# Patient Record
Sex: Male | Born: 1960
Health system: Southern US, Community
[De-identification: ages and names within clinical notes are randomized; demographics above are authoritative.]

## PROBLEM LIST (undated history)

## (undated) DIAGNOSIS — C439 Malignant melanoma of skin, unspecified: Secondary | ICD-10-CM

## (undated) HISTORY — PX: MELANOMA EXCISION: SHX5266

---

## 2004-05-20 ENCOUNTER — Emergency Department (HOSPITAL_COMMUNITY): Admission: EM | Admit: 2004-05-20 | Discharge: 2004-05-21 | Payer: Self-pay | Admitting: Emergency Medicine

## 2004-05-21 IMAGING — CR DG CHEST 2V
2 series · 2 of 2 positions shown · non-contrast
Comparison: none

CLINICAL DATA: Chest pain. 
 TWO VIEW CHEST
 No prior studies.
 The heart size and mediastinal contours are unremarkable.  The lungs are clear.  The visualized skeleton is unremarkable.  
 IMPRESSION
 No active disease.

[view not recorded (1 of 2)]
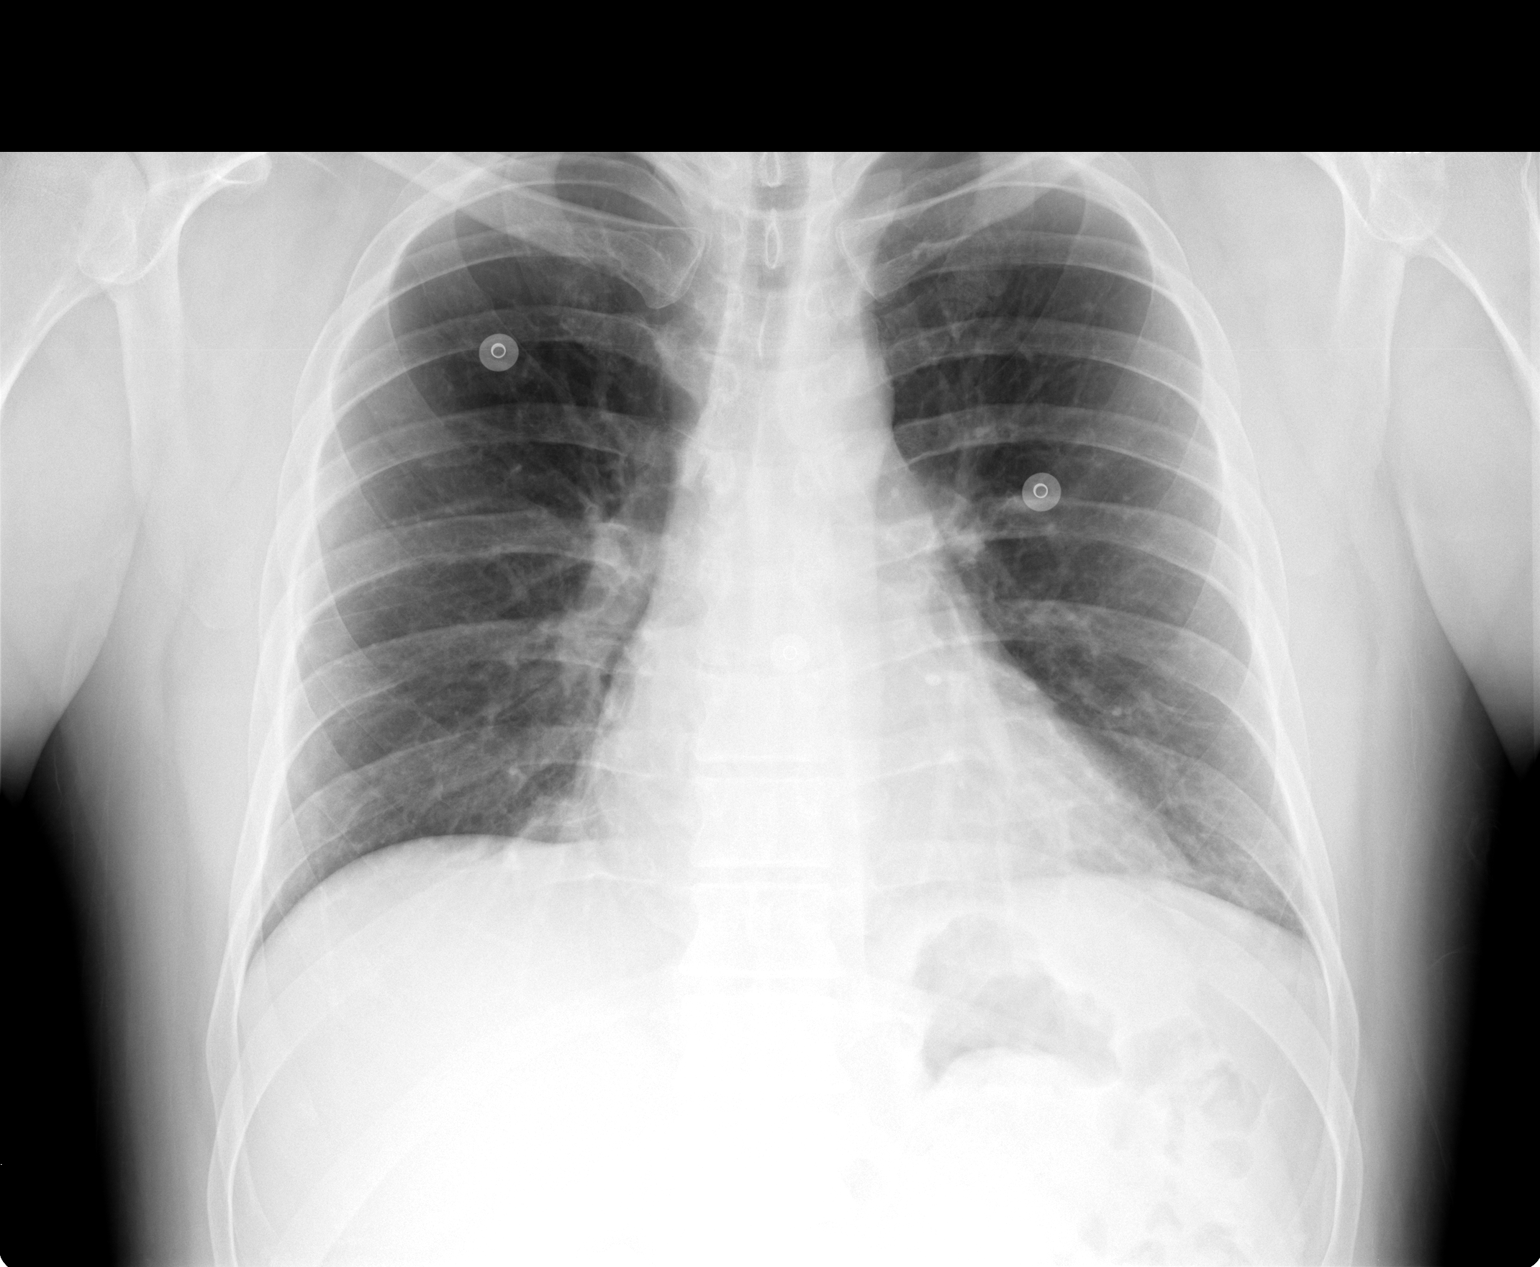

[view not recorded (2 of 2)]
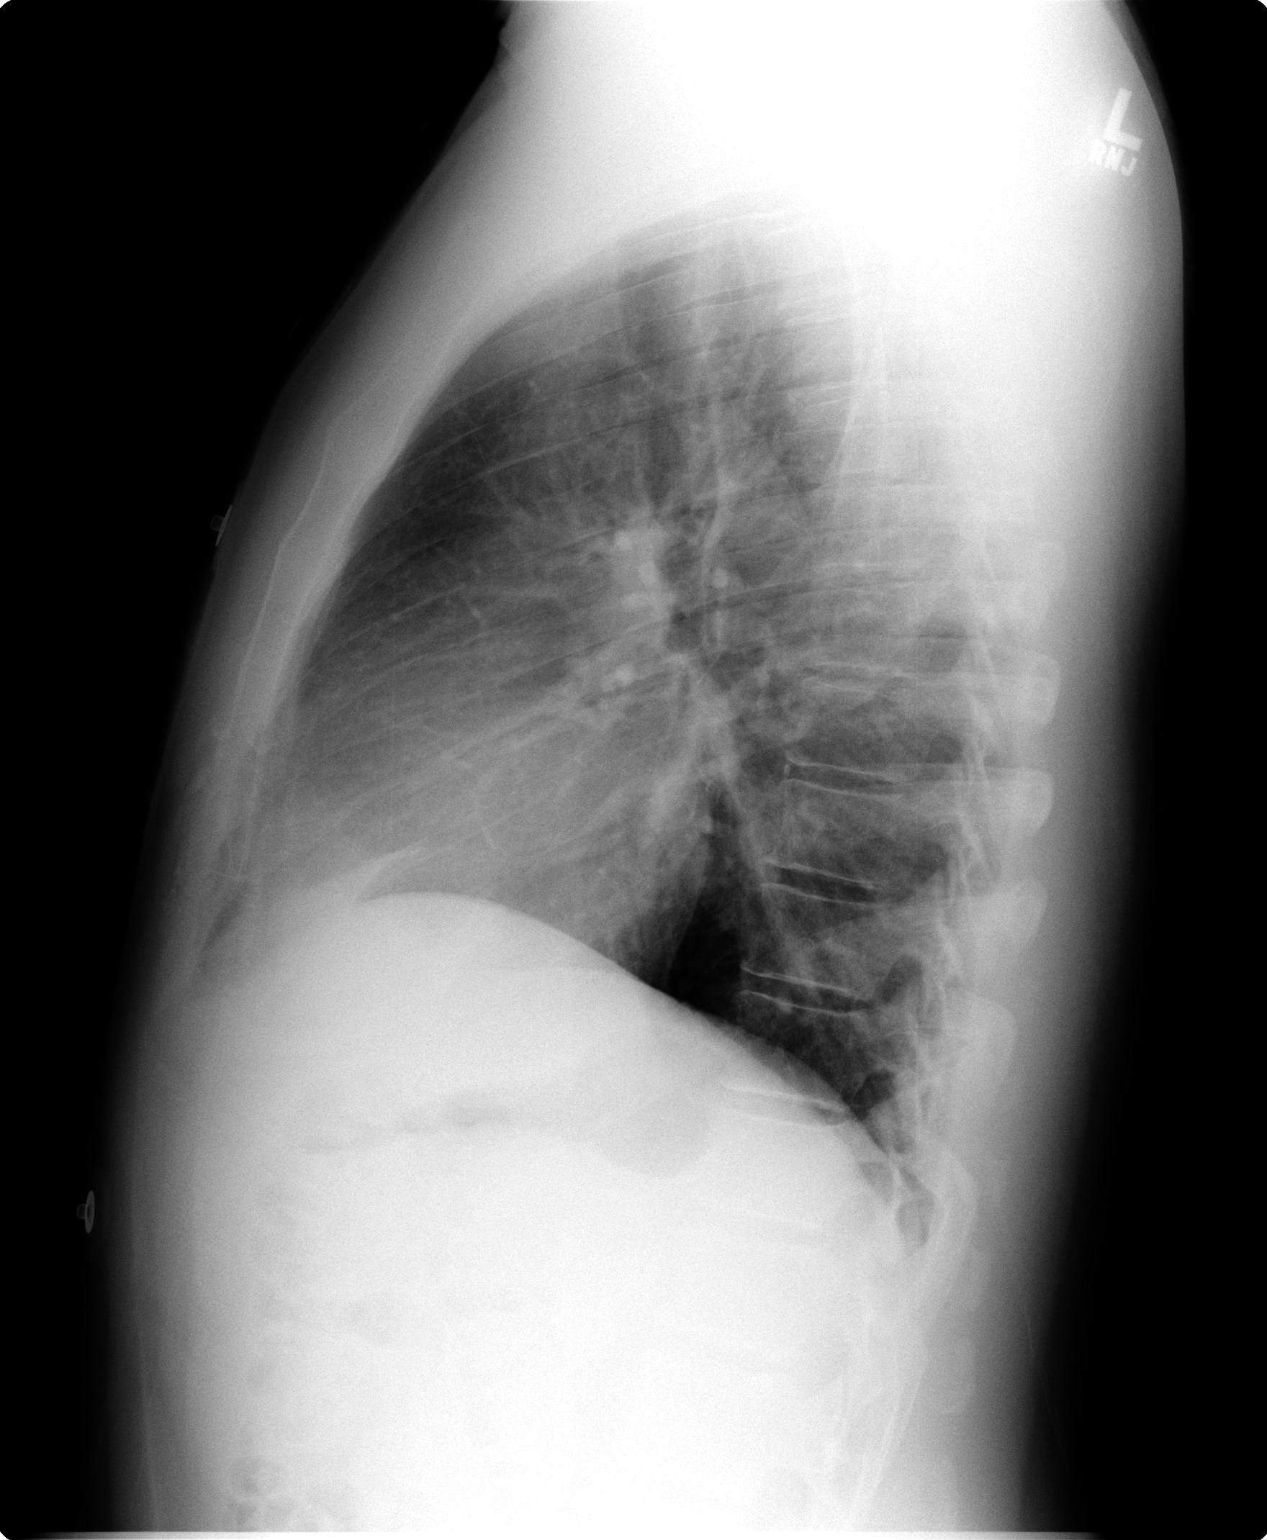

[2 of 2 positions shown; findings below may reference images not displayed]

## 2006-04-15 ENCOUNTER — Emergency Department (HOSPITAL_COMMUNITY): Admission: EM | Admit: 2006-04-15 | Discharge: 2006-04-16 | Payer: Self-pay | Admitting: Emergency Medicine

## 2006-04-15 IMAGING — US US ART/VEN ABD/PELV/SCROTUM DOPPLER COMPLETE
1 series · 14 of 25 positions shown · non-contrast
Comparison: None.

CLINICAL DATA: Bilateral scrotal pain.

SCROTAL ULTRASOUND [DATE]:
TECHNIQUE: Color and duplex Doppler ultrasound was utilized to evaluate blood
flow to the testicles and epididymes.

[Series 1: unknown · 0.09mm/px · 14 of 35 slices shown]
[im 1/35]
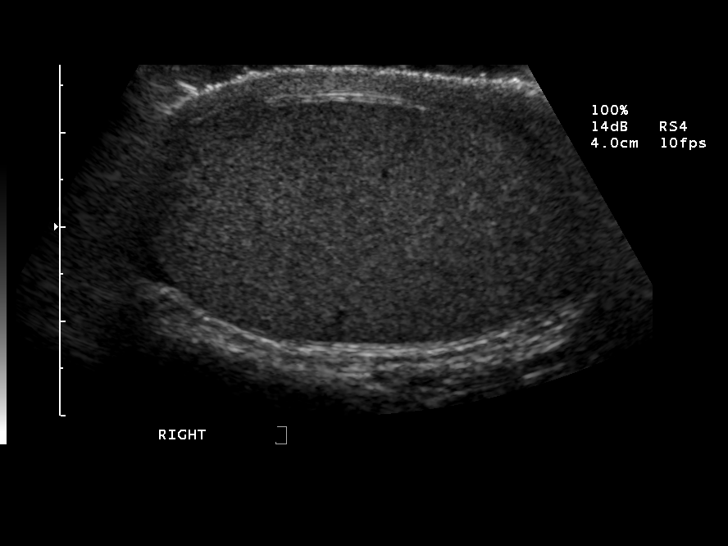
[im 3/35]
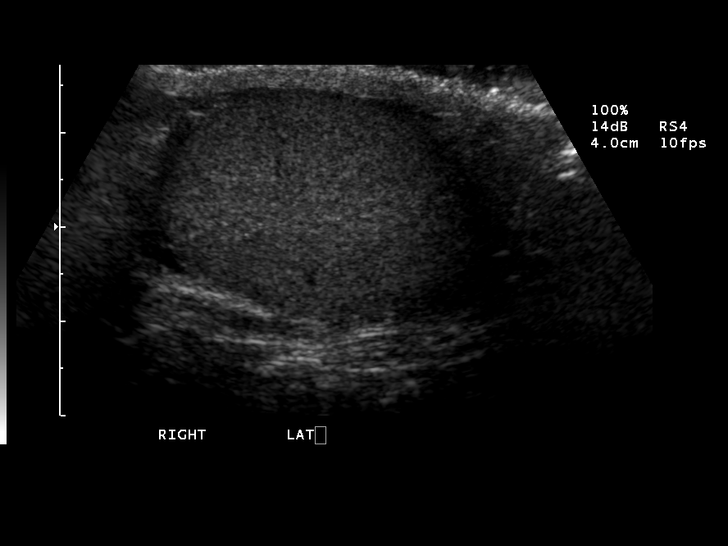
[im 6/35]
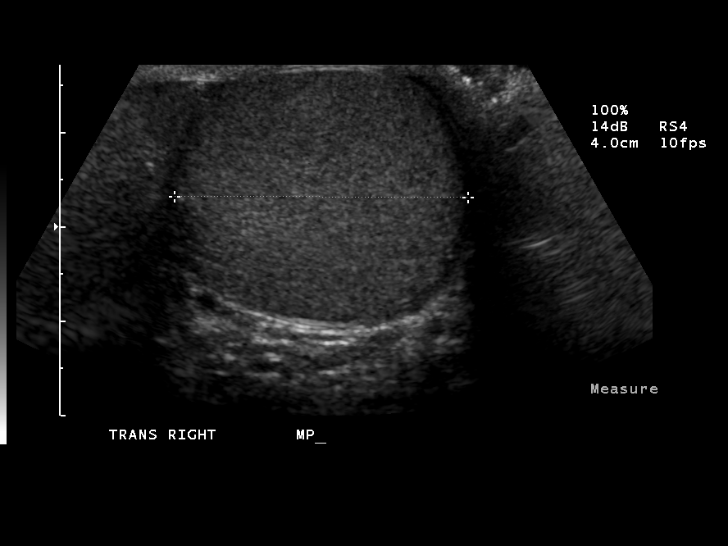
[im 9/35]
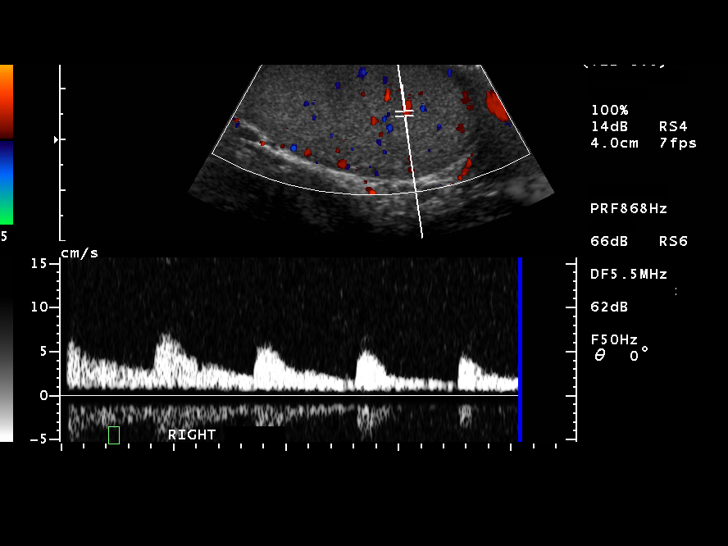
[im 12/35]
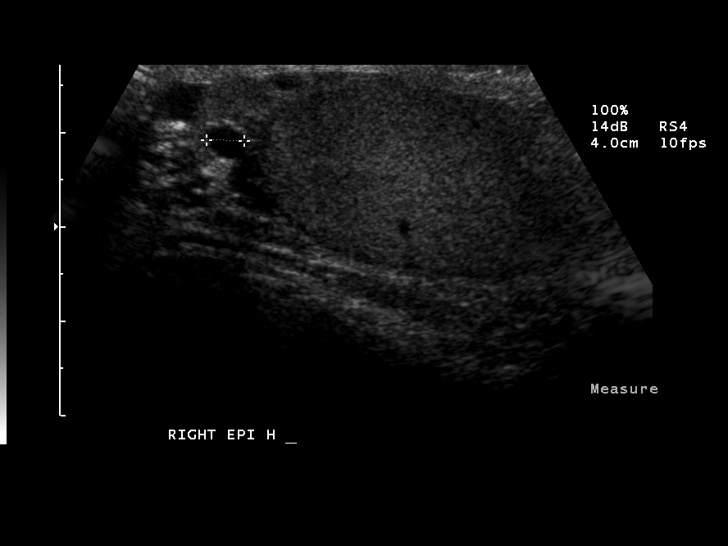
[im 13/35]
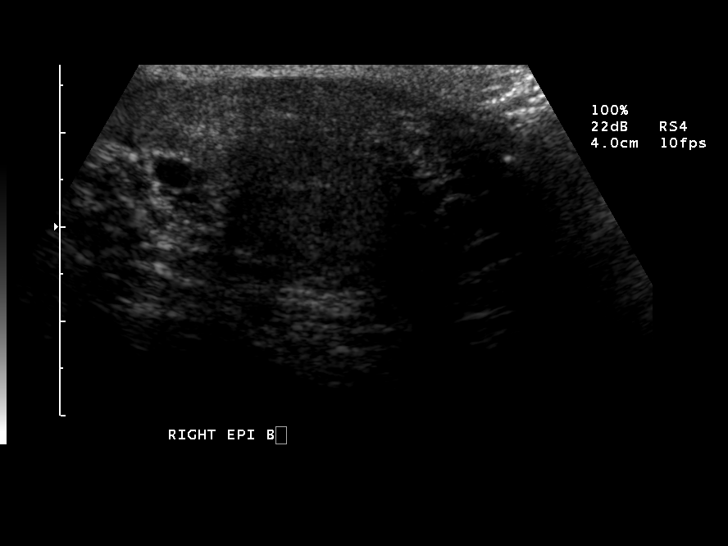
[im 16/35]
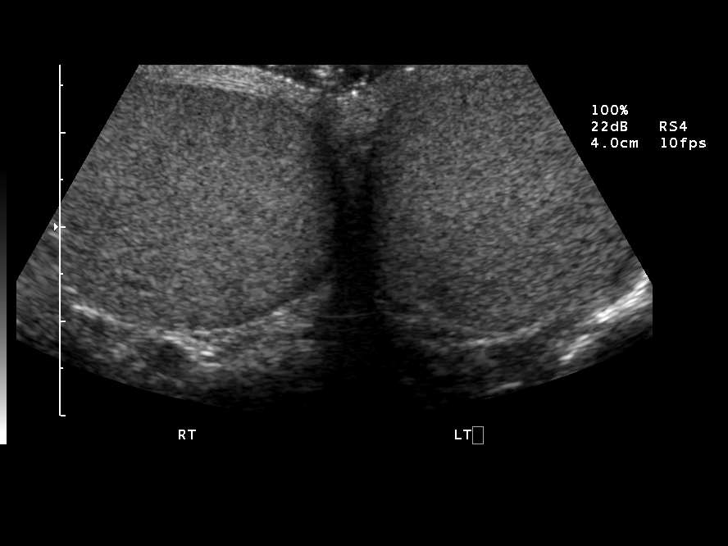
[im 19/35]
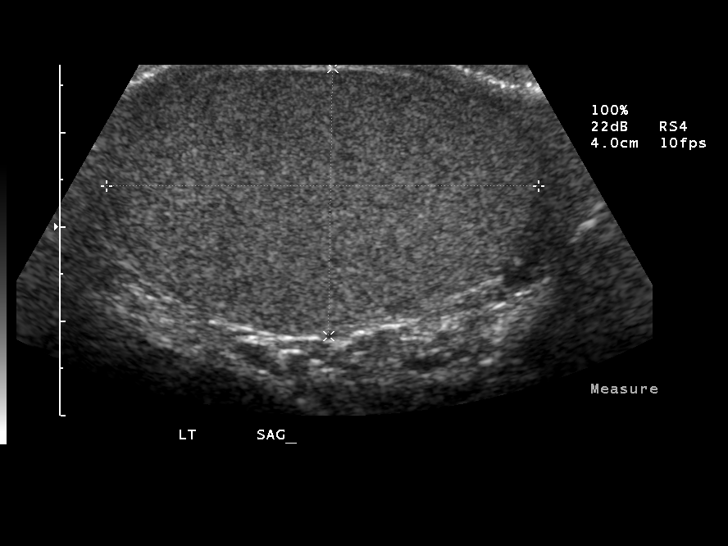
[im 22/35]
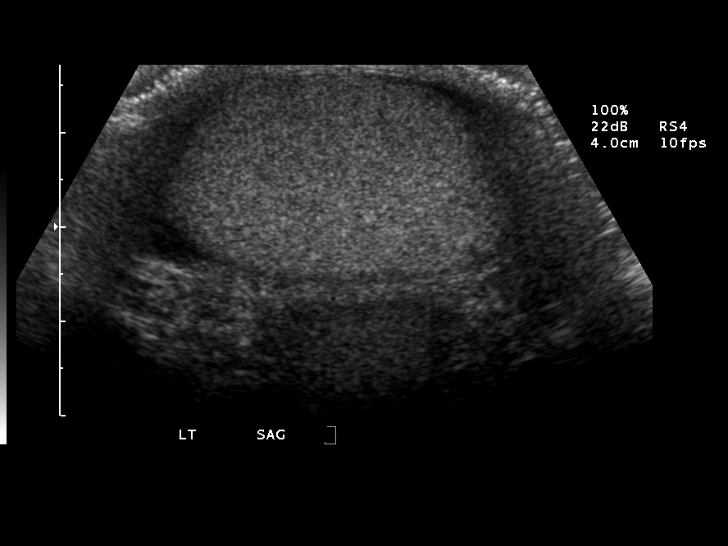
[im 23/35]
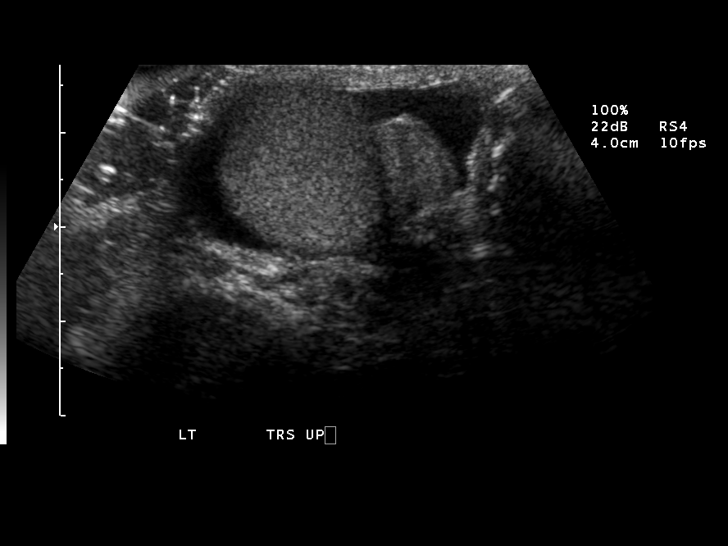
[im 26/35]
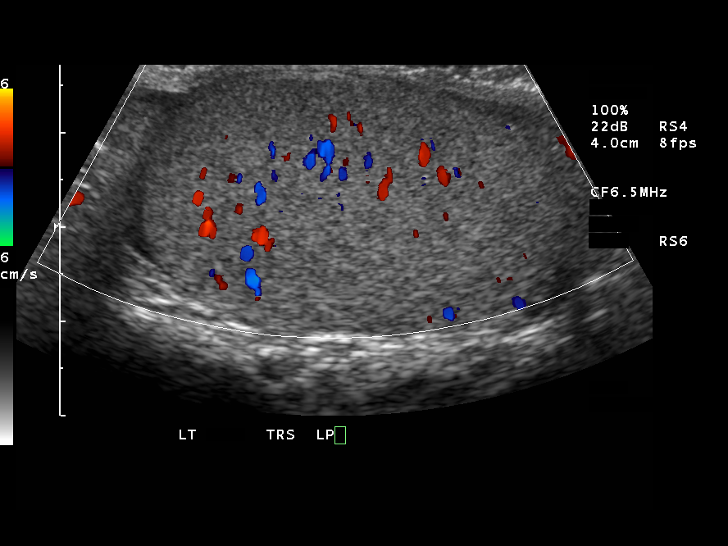
[im 29/35]
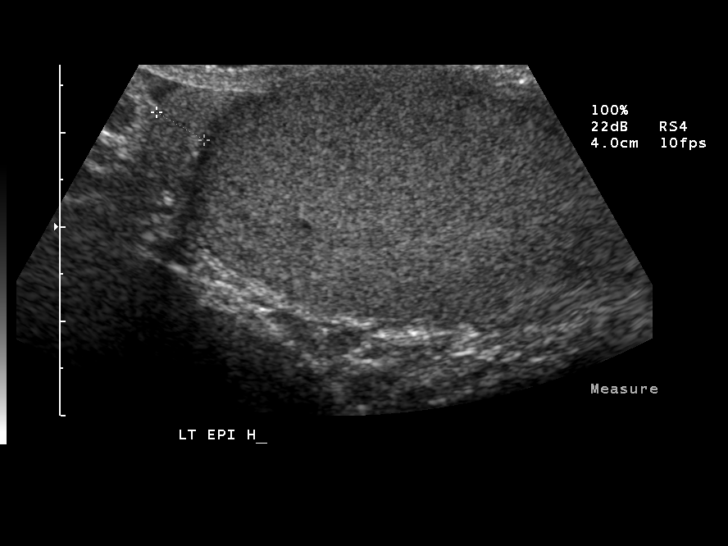
[im 32/35]
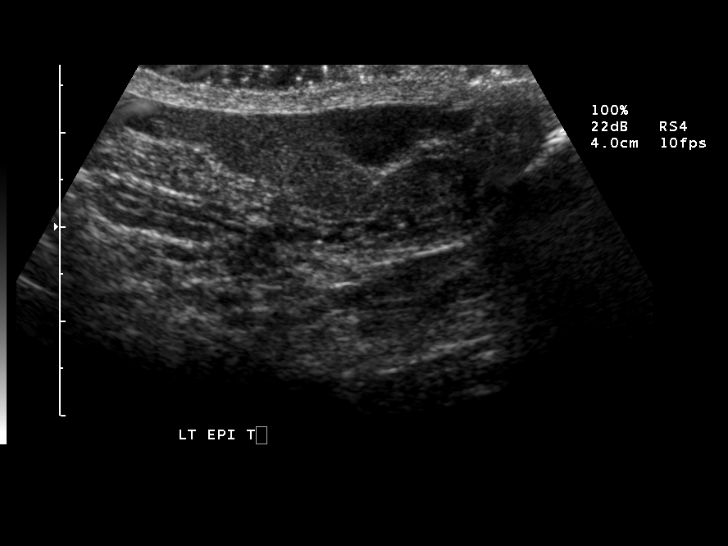
[im 35/35]
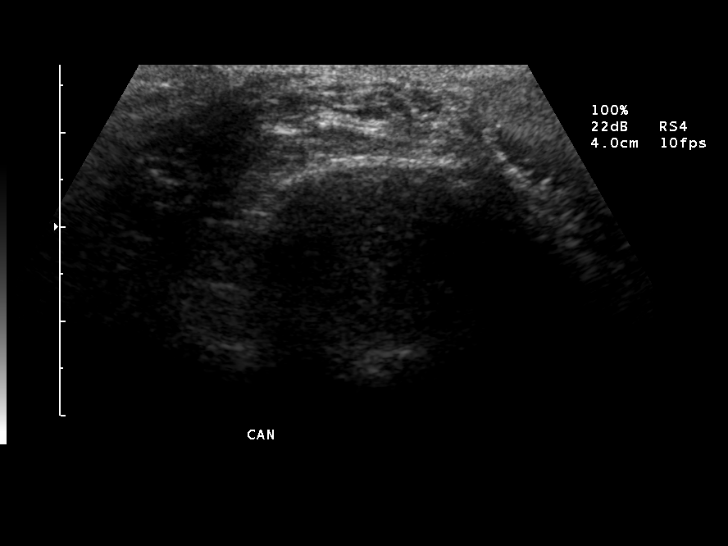

[14 of 25 positions shown; findings below may reference images not displayed]

FINDINGS: Right testicle:  Homogeneous echotexture without focal abnormality.
Approximately 4.6 x 2.6 x 3.1 cm in size.

Left testicle:  Homogeneous echotexture without focal abnormality. Approximately
4.6 x 2.8 x 3.07 meters in size.

Right epididymis:  Approximately 4 mm epididymal cyst. Normal otherwise.

Left epididymis:  Normal.

Other:  No evidence of hydrocele or varicocele.
IMPRESSION: Normal scrotal ultrasound with the exception of a 4 mm right epididymal cyst. 

DOPPLER ULTRASOUND EVALUATION OF THE SCROTUM [DATE]:
FINDINGS: Normal color Doppler signal was identified in both testicles. Normal
arterial and venous waveforms were obtained. Blood flow to the right and left
epididymis is normal. There is no varicocele.
IMPRESSION: No evidence of testicular torsion. Normal blood flow to both testicles and both
epididymis.

## 2016-06-29 DIAGNOSIS — D485 Neoplasm of uncertain behavior of skin: Secondary | ICD-10-CM | POA: Diagnosis not present

## 2016-06-29 DIAGNOSIS — C434 Malignant melanoma of scalp and neck: Secondary | ICD-10-CM | POA: Diagnosis not present

## 2016-06-30 DIAGNOSIS — Z125 Encounter for screening for malignant neoplasm of prostate: Secondary | ICD-10-CM | POA: Diagnosis not present

## 2016-06-30 DIAGNOSIS — E785 Hyperlipidemia, unspecified: Secondary | ICD-10-CM | POA: Diagnosis not present

## 2016-06-30 DIAGNOSIS — Z0000 Encounter for general adult medical examination without abnormal findings: Secondary | ICD-10-CM | POA: Diagnosis not present

## 2016-07-14 DIAGNOSIS — C439 Malignant melanoma of skin, unspecified: Secondary | ICD-10-CM | POA: Diagnosis not present

## 2016-07-27 DIAGNOSIS — C490 Malignant neoplasm of connective and soft tissue of head, face and neck: Secondary | ICD-10-CM | POA: Diagnosis not present

## 2016-07-27 DIAGNOSIS — C439 Malignant melanoma of skin, unspecified: Secondary | ICD-10-CM | POA: Diagnosis not present

## 2016-07-27 DIAGNOSIS — C4322 Malignant melanoma of left ear and external auricular canal: Secondary | ICD-10-CM | POA: Diagnosis not present

## 2016-07-27 DIAGNOSIS — Z79899 Other long term (current) drug therapy: Secondary | ICD-10-CM | POA: Diagnosis not present

## 2016-07-27 DIAGNOSIS — F17200 Nicotine dependence, unspecified, uncomplicated: Secondary | ICD-10-CM | POA: Diagnosis not present

## 2016-09-01 ENCOUNTER — Telehealth: Payer: Self-pay | Admitting: Oncology

## 2016-09-01 ENCOUNTER — Encounter: Payer: Self-pay | Admitting: Oncology

## 2016-09-01 DIAGNOSIS — C434 Malignant melanoma of scalp and neck: Secondary | ICD-10-CM | POA: Diagnosis not present

## 2016-09-01 NOTE — Telephone Encounter (Signed)
Spoke with pt wife and she made me aware pt had surgery completed with [facility].  Consulted with Dr Rolanda Jay and scheduled with [facility].  Spoke with referring provider Helene Kelp) gave appointment date/time and she will contact pt with appt info.

## 2016-09-09 ENCOUNTER — Telehealth: Payer: Self-pay | Admitting: *Deleted

## 2016-09-09 NOTE — Telephone Encounter (Signed)
Called patient to remind him of his appointment with Dr. Alen Blew tomorrow. Patient verbalized understanding.

## 2016-09-10 ENCOUNTER — Ambulatory Visit (HOSPITAL_BASED_OUTPATIENT_CLINIC_OR_DEPARTMENT_OTHER): Payer: 59 | Admitting: Oncology

## 2016-09-10 DIAGNOSIS — C439 Malignant melanoma of skin, unspecified: Secondary | ICD-10-CM | POA: Insufficient documentation

## 2016-09-10 NOTE — Progress Notes (Signed)
[facility] Initial Visit:    CHIEF COMPLAINTS/PURPOSE OF CONSULTATION: Melanoma.       HISTORY OF PRESENTING ILLNESS: Martin Robinson 55 y.o. male currently of [place] where he lived the 35 of his life. He is in excellent health and shape without any comorbid conditions. He was noted to have a lesion around his left ureter noted by his wife. He was noted for the last 6 months and based on these findings he was evaluated by dermatology in August 2017. Initial biopsy showed malignant melanoma with thickness of at least 1 mm without ulceration. Based on these findings he was referred to Dr. Clovis Riley at [facility]. He underwent wide excision of a the left posterior auricular area and sentinel lymph node sampling on 07/27/2016. The final pathology showed residual melanoma with the depth of invasion of 0.3 mm for a total of 1.3 mm with original melanoma. 2 lymph nodes showed no evidence of malignancy and the third lymph node in the inferior part area showed a few isolated cells positive for pan mel. Patient recovered well from surgery without any recent complaints. He denied any previous history of melanoma or family history. He does have history of excessive skin exposure as a child. He has no other complaints at this time.  He does not report any headaches, blurry vision, syncope or seizures. He is not reporting any fevers or chills or sweats. He does not report any cough, wheezing or hemoptysis. He does not report any nausea, vomiting or abdominal pain. He does not report any frequency urgency or hesitancy. He does not report any skeletal complaints. Remaining review of systems unremarkable.  Review of Systems  Constitutional: Negative.   HENT:  Negative.   Eyes: Negative.   Endocrine: Negative.   Genitourinary: Negative.    Skin: Negative.   Neurological: Negative.   Hematological: Negative.   Psychiatric/Behavioral: Negative.    His past  medical history and surgical history was unremarkable previous 2 recent diagnosis of melanoma.  SOCIAL HISTORY: Social History   Social History  . Marital status: Married.     Spouse name: N/A  . Number of children: N/A  . Years of education: N/A   Occupational History  . Not on file.   Social History Main Topics  . Smoking status: Nonsmoker occasionally uses cigars.   . Smokeless tobacco: Not on file  . Alcohol use Not on file  . Drug use: Unknown  . Sexual activity: Not on file   Other Topics Concern  . Not on file   Social History Narrative  . No narrative on file    FAMILY HISTORY Denied any history of melanoma or basal cell carcinoma is noted.  ALLERGIES:  has No Known Allergies.  MEDICATIONS: He takes no medication.    PHYSICAL EXAMINATION:  ECOG PERFORMANCE STATUS: 0 - Asymptomatic   Vitals:   09/10/16 1107  BP: 103/77  Pulse: 82  Resp: 18  Temp: 98.2 F (36.8 C)    Filed Weights   09/10/16 1107  Weight: 188 lb 1.6 oz (85.3 kg)     Physical Exam  Constitutional: He is oriented to person, place, and time and well-developed, well-nourished, and in no distress.  HENT:  Head: Normocephalic and atraumatic.  Eyes: Pupils are equal, round, and reactive to light.  Neck: Neck supple.  Noted scar on the left periauricular area. No lymphadenopathy.  Cardiovascular: Normal rate, regular rhythm and normal heart sounds.   Pulmonary/Chest: Breath sounds  normal. No respiratory distress.  Abdominal: Soft. He exhibits no distension. There is no tenderness. There is no rebound.  Musculoskeletal: Normal range of motion.  Neurological: He is alert and oriented to person, place, and time.  Skin: Skin is warm. No rash noted.  No rashes or lesions noted.    ASSESSMENT/PLAN   55 year old gentleman with the following issues:  1. Malignant melanoma diagnosed in August 2017. He was noted to have a left auricular lesion that was biopsied and proven to be  malignant melanoma. He is status post wide excision and sentinel lymph node sampling with the final pathology September 2017 showed total depth of invasion of 1.3 mm without any evidence of lymph node involvement. There are a few scattered cells that stained positive for pan mel although no convincing evidence of metastatic involvement and these lymph glands.  The natural course of this disease was discussed with the patient and his wife. His case was also discussed in the melanoma tumor board and his pathology slides were reviewed. At this time is felt that his disease remains at low risk and does not warrant adjuvant therapy given the low volume disease in these lymph glands and possibly these findings related to false-positive as rather than actual melanoma spread. However, he is at increased risk of developing recurrent disease as well as developing other melanoma of the skin. Continue with surveillance is paramount and I have recommended continue dermatology surveillance with periodic physical examination and lymph node basin palpation required.  He is agreeable with this plan and has dermatology follow-up already in place. I have offered my services to continue surveillance if he wishes to do so.  2. Skin protection strategies: These were discussed today in detail including wearing the appropriate clothing and sunblock to prevent further skin damage and prevent other skin cancer.  3. Follow-up: I'm happy to see him in the future as needed.  All questions were answered. The patient knows to call the clinic with any problems, questions or concerns.  This note was electronically signed.    [facility], MD  09/10/2016 11:37 AM

## 2016-09-20 DIAGNOSIS — Z23 Encounter for immunization: Secondary | ICD-10-CM | POA: Diagnosis not present

## 2016-09-20 DIAGNOSIS — D1800 Hemangioma unspecified site: Secondary | ICD-10-CM | POA: Diagnosis not present

## 2016-09-20 DIAGNOSIS — Z85820 Personal history of malignant melanoma of skin: Secondary | ICD-10-CM | POA: Diagnosis not present

## 2016-09-20 DIAGNOSIS — L570 Actinic keratosis: Secondary | ICD-10-CM | POA: Diagnosis not present

## 2016-09-20 DIAGNOSIS — L814 Other melanin hyperpigmentation: Secondary | ICD-10-CM | POA: Diagnosis not present

## 2016-09-20 DIAGNOSIS — L821 Other seborrheic keratosis: Secondary | ICD-10-CM | POA: Diagnosis not present

## 2016-09-20 DIAGNOSIS — D2261 Melanocytic nevi of right upper limb, including shoulder: Secondary | ICD-10-CM | POA: Diagnosis not present

## 2016-09-20 DIAGNOSIS — D225 Melanocytic nevi of trunk: Secondary | ICD-10-CM | POA: Diagnosis not present

## 2017-02-04 DIAGNOSIS — Z9221 Personal history of antineoplastic chemotherapy: Secondary | ICD-10-CM | POA: Diagnosis not present

## 2017-02-04 DIAGNOSIS — C439 Malignant melanoma of skin, unspecified: Secondary | ICD-10-CM | POA: Diagnosis not present

## 2017-02-04 DIAGNOSIS — Z87891 Personal history of nicotine dependence: Secondary | ICD-10-CM | POA: Diagnosis not present

## 2017-02-04 DIAGNOSIS — C434 Malignant melanoma of scalp and neck: Secondary | ICD-10-CM | POA: Diagnosis not present

## 2017-06-30 DIAGNOSIS — Z85820 Personal history of malignant melanoma of skin: Secondary | ICD-10-CM | POA: Diagnosis not present

## 2017-06-30 DIAGNOSIS — L821 Other seborrheic keratosis: Secondary | ICD-10-CM | POA: Diagnosis not present

## 2017-06-30 DIAGNOSIS — L814 Other melanin hyperpigmentation: Secondary | ICD-10-CM | POA: Diagnosis not present

## 2017-06-30 DIAGNOSIS — D485 Neoplasm of uncertain behavior of skin: Secondary | ICD-10-CM | POA: Diagnosis not present

## 2017-06-30 DIAGNOSIS — L570 Actinic keratosis: Secondary | ICD-10-CM | POA: Diagnosis not present

## 2017-06-30 DIAGNOSIS — D225 Melanocytic nevi of trunk: Secondary | ICD-10-CM | POA: Diagnosis not present

## 2017-06-30 DIAGNOSIS — D1800 Hemangioma unspecified site: Secondary | ICD-10-CM | POA: Diagnosis not present

## 2017-07-14 DIAGNOSIS — Z125 Encounter for screening for malignant neoplasm of prostate: Secondary | ICD-10-CM | POA: Diagnosis not present

## 2017-07-14 DIAGNOSIS — Z23 Encounter for immunization: Secondary | ICD-10-CM | POA: Diagnosis not present

## 2017-07-14 DIAGNOSIS — Z0000 Encounter for general adult medical examination without abnormal findings: Secondary | ICD-10-CM | POA: Diagnosis not present

## 2017-07-14 DIAGNOSIS — E7800 Pure hypercholesterolemia, unspecified: Secondary | ICD-10-CM | POA: Diagnosis not present

## 2017-07-14 DIAGNOSIS — Z1211 Encounter for screening for malignant neoplasm of colon: Secondary | ICD-10-CM | POA: Diagnosis not present

## 2017-10-13 DIAGNOSIS — E7800 Pure hypercholesterolemia, unspecified: Secondary | ICD-10-CM | POA: Diagnosis not present

## 2017-10-20 DIAGNOSIS — Z23 Encounter for immunization: Secondary | ICD-10-CM | POA: Diagnosis not present

## 2017-11-23 DIAGNOSIS — E7800 Pure hypercholesterolemia, unspecified: Secondary | ICD-10-CM | POA: Diagnosis not present

## 2017-12-28 MED FILL — ROSUVASTATIN CALCIUM 10 MG: 10 | 30 days supply | Qty: 30 | Fill #0

## 2018-01-05 DIAGNOSIS — E7800 Pure hypercholesterolemia, unspecified: Secondary | ICD-10-CM | POA: Diagnosis not present

## 2018-01-26 MED FILL — ROSUVASTATIN CALCIUM 10 MG: 10 | 30 days supply | Qty: 30 | Fill #0

## 2018-02-16 DIAGNOSIS — L814 Other melanin hyperpigmentation: Secondary | ICD-10-CM | POA: Diagnosis not present

## 2018-02-16 DIAGNOSIS — D485 Neoplasm of uncertain behavior of skin: Secondary | ICD-10-CM | POA: Diagnosis not present

## 2018-02-16 DIAGNOSIS — L821 Other seborrheic keratosis: Secondary | ICD-10-CM | POA: Diagnosis not present

## 2018-02-16 DIAGNOSIS — L570 Actinic keratosis: Secondary | ICD-10-CM | POA: Diagnosis not present

## 2018-02-16 DIAGNOSIS — L219 Seborrheic dermatitis, unspecified: Secondary | ICD-10-CM | POA: Diagnosis not present

## 2018-02-16 DIAGNOSIS — D1800 Hemangioma unspecified site: Secondary | ICD-10-CM | POA: Diagnosis not present

## 2018-02-16 DIAGNOSIS — Z85820 Personal history of malignant melanoma of skin: Secondary | ICD-10-CM | POA: Diagnosis not present

## 2018-02-16 DIAGNOSIS — C44319 Basal cell carcinoma of skin of other parts of face: Secondary | ICD-10-CM | POA: Diagnosis not present

## 2018-02-16 DIAGNOSIS — D225 Melanocytic nevi of trunk: Secondary | ICD-10-CM | POA: Diagnosis not present

## 2018-03-17 MED FILL — ROSUVASTATIN CALCIUM 10 MG: 10 | 30 days supply | Qty: 30 | Fill #1

## 2018-04-13 DIAGNOSIS — Z23 Encounter for immunization: Secondary | ICD-10-CM | POA: Diagnosis not present

## 2018-04-18 DIAGNOSIS — C44319 Basal cell carcinoma of skin of other parts of face: Secondary | ICD-10-CM | POA: Diagnosis not present

## 2018-07-27 DIAGNOSIS — Z0000 Encounter for general adult medical examination without abnormal findings: Secondary | ICD-10-CM | POA: Diagnosis not present

## 2018-07-27 DIAGNOSIS — Z125 Encounter for screening for malignant neoplasm of prostate: Secondary | ICD-10-CM | POA: Diagnosis not present

## 2018-07-27 DIAGNOSIS — Z1211 Encounter for screening for malignant neoplasm of colon: Secondary | ICD-10-CM | POA: Diagnosis not present

## 2018-07-27 DIAGNOSIS — Z85820 Personal history of malignant melanoma of skin: Secondary | ICD-10-CM | POA: Diagnosis not present

## 2018-07-27 DIAGNOSIS — Z23 Encounter for immunization: Secondary | ICD-10-CM | POA: Diagnosis not present

## 2018-07-27 DIAGNOSIS — E7800 Pure hypercholesterolemia, unspecified: Secondary | ICD-10-CM | POA: Diagnosis not present

## 2018-08-15 DIAGNOSIS — L814 Other melanin hyperpigmentation: Secondary | ICD-10-CM | POA: Diagnosis not present

## 2018-08-15 DIAGNOSIS — Z85820 Personal history of malignant melanoma of skin: Secondary | ICD-10-CM | POA: Diagnosis not present

## 2018-08-15 DIAGNOSIS — Z23 Encounter for immunization: Secondary | ICD-10-CM | POA: Diagnosis not present

## 2018-08-15 DIAGNOSIS — L821 Other seborrheic keratosis: Secondary | ICD-10-CM | POA: Diagnosis not present

## 2018-08-15 DIAGNOSIS — Z85828 Personal history of other malignant neoplasm of skin: Secondary | ICD-10-CM | POA: Diagnosis not present

## 2018-08-15 DIAGNOSIS — D1800 Hemangioma unspecified site: Secondary | ICD-10-CM | POA: Diagnosis not present

## 2018-08-15 DIAGNOSIS — L570 Actinic keratosis: Secondary | ICD-10-CM | POA: Diagnosis not present

## 2018-08-15 DIAGNOSIS — D225 Melanocytic nevi of trunk: Secondary | ICD-10-CM | POA: Diagnosis not present

## 2018-09-26 DIAGNOSIS — H5203 Hypermetropia, bilateral: Secondary | ICD-10-CM | POA: Diagnosis not present

## 2019-03-12 DIAGNOSIS — Z85828 Personal history of other malignant neoplasm of skin: Secondary | ICD-10-CM | POA: Diagnosis not present

## 2019-03-12 DIAGNOSIS — L821 Other seborrheic keratosis: Secondary | ICD-10-CM | POA: Diagnosis not present

## 2019-03-12 DIAGNOSIS — D225 Melanocytic nevi of trunk: Secondary | ICD-10-CM | POA: Diagnosis not present

## 2019-03-12 DIAGNOSIS — L570 Actinic keratosis: Secondary | ICD-10-CM | POA: Diagnosis not present

## 2019-03-12 DIAGNOSIS — Z85820 Personal history of malignant melanoma of skin: Secondary | ICD-10-CM | POA: Diagnosis not present

## 2019-03-12 DIAGNOSIS — L814 Other melanin hyperpigmentation: Secondary | ICD-10-CM | POA: Diagnosis not present

## 2019-09-26 DIAGNOSIS — D225 Melanocytic nevi of trunk: Secondary | ICD-10-CM | POA: Diagnosis not present

## 2019-09-26 DIAGNOSIS — L570 Actinic keratosis: Secondary | ICD-10-CM | POA: Diagnosis not present

## 2019-09-26 DIAGNOSIS — Z85820 Personal history of malignant melanoma of skin: Secondary | ICD-10-CM | POA: Diagnosis not present

## 2019-09-26 DIAGNOSIS — L820 Inflamed seborrheic keratosis: Secondary | ICD-10-CM | POA: Diagnosis not present

## 2019-09-26 DIAGNOSIS — L821 Other seborrheic keratosis: Secondary | ICD-10-CM | POA: Diagnosis not present

## 2019-09-26 DIAGNOSIS — Z85828 Personal history of other malignant neoplasm of skin: Secondary | ICD-10-CM | POA: Diagnosis not present

## 2019-09-26 DIAGNOSIS — L814 Other melanin hyperpigmentation: Secondary | ICD-10-CM | POA: Diagnosis not present

## 2019-09-27 DIAGNOSIS — Z13220 Encounter for screening for lipoid disorders: Secondary | ICD-10-CM | POA: Diagnosis not present

## 2019-09-27 DIAGNOSIS — Z125 Encounter for screening for malignant neoplasm of prostate: Secondary | ICD-10-CM | POA: Diagnosis not present

## 2019-09-27 DIAGNOSIS — Z1159 Encounter for screening for other viral diseases: Secondary | ICD-10-CM | POA: Diagnosis not present

## 2019-09-27 DIAGNOSIS — Z85820 Personal history of malignant melanoma of skin: Secondary | ICD-10-CM | POA: Diagnosis not present

## 2019-09-27 DIAGNOSIS — R7301 Impaired fasting glucose: Secondary | ICD-10-CM | POA: Diagnosis not present

## 2019-09-27 DIAGNOSIS — Z0000 Encounter for general adult medical examination without abnormal findings: Secondary | ICD-10-CM | POA: Diagnosis not present

## 2019-09-27 DIAGNOSIS — Z23 Encounter for immunization: Secondary | ICD-10-CM | POA: Diagnosis not present

## 2020-03-25 DIAGNOSIS — D225 Melanocytic nevi of trunk: Secondary | ICD-10-CM | POA: Diagnosis not present

## 2020-03-25 DIAGNOSIS — Z85820 Personal history of malignant melanoma of skin: Secondary | ICD-10-CM | POA: Diagnosis not present

## 2020-03-25 DIAGNOSIS — Z85828 Personal history of other malignant neoplasm of skin: Secondary | ICD-10-CM | POA: Diagnosis not present

## 2020-03-25 DIAGNOSIS — L578 Other skin changes due to chronic exposure to nonionizing radiation: Secondary | ICD-10-CM | POA: Diagnosis not present

## 2020-03-25 DIAGNOSIS — L814 Other melanin hyperpigmentation: Secondary | ICD-10-CM | POA: Diagnosis not present

## 2020-03-25 DIAGNOSIS — L570 Actinic keratosis: Secondary | ICD-10-CM | POA: Diagnosis not present

## 2020-03-25 DIAGNOSIS — L821 Other seborrheic keratosis: Secondary | ICD-10-CM | POA: Diagnosis not present

## 2020-07-13 DIAGNOSIS — U071 COVID-19: Secondary | ICD-10-CM | POA: Diagnosis not present

## 2020-07-13 DIAGNOSIS — R509 Fever, unspecified: Secondary | ICD-10-CM | POA: Diagnosis not present

## 2020-07-13 DIAGNOSIS — R05 Cough: Secondary | ICD-10-CM | POA: Diagnosis not present

## 2020-07-13 DIAGNOSIS — Z20822 Contact with and (suspected) exposure to covid-19: Secondary | ICD-10-CM | POA: Diagnosis not present

## 2020-09-30 DIAGNOSIS — Z125 Encounter for screening for malignant neoplasm of prostate: Secondary | ICD-10-CM | POA: Diagnosis not present

## 2020-09-30 DIAGNOSIS — Z0000 Encounter for general adult medical examination without abnormal findings: Secondary | ICD-10-CM | POA: Diagnosis not present

## 2020-09-30 DIAGNOSIS — R7303 Prediabetes: Secondary | ICD-10-CM | POA: Diagnosis not present

## 2020-09-30 DIAGNOSIS — Z85820 Personal history of malignant melanoma of skin: Secondary | ICD-10-CM | POA: Diagnosis not present

## 2020-09-30 DIAGNOSIS — E7800 Pure hypercholesterolemia, unspecified: Secondary | ICD-10-CM | POA: Diagnosis not present

## 2020-10-07 DIAGNOSIS — L821 Other seborrheic keratosis: Secondary | ICD-10-CM | POA: Diagnosis not present

## 2020-10-07 DIAGNOSIS — Z85820 Personal history of malignant melanoma of skin: Secondary | ICD-10-CM | POA: Diagnosis not present

## 2020-10-07 DIAGNOSIS — Z85828 Personal history of other malignant neoplasm of skin: Secondary | ICD-10-CM | POA: Diagnosis not present

## 2020-10-07 DIAGNOSIS — D225 Melanocytic nevi of trunk: Secondary | ICD-10-CM | POA: Diagnosis not present

## 2020-10-07 DIAGNOSIS — L814 Other melanin hyperpigmentation: Secondary | ICD-10-CM | POA: Diagnosis not present

## 2020-10-07 DIAGNOSIS — L578 Other skin changes due to chronic exposure to nonionizing radiation: Secondary | ICD-10-CM | POA: Diagnosis not present

## 2020-10-07 DIAGNOSIS — L570 Actinic keratosis: Secondary | ICD-10-CM | POA: Diagnosis not present

## 2021-07-03 ENCOUNTER — Observation Stay (HOSPITAL_BASED_OUTPATIENT_CLINIC_OR_DEPARTMENT_OTHER)
Admission: EM | Admit: 2021-07-03 | Discharge: 2021-07-04 | Disposition: A | Discharge status: Home / Self Care | Payer: Commercial Managed Care - PPO | Attending: Internal Medicine | Admitting: Internal Medicine

## 2021-07-03 ENCOUNTER — Other Ambulatory Visit: Payer: Self-pay

## 2021-07-03 ENCOUNTER — Encounter (HOSPITAL_BASED_OUTPATIENT_CLINIC_OR_DEPARTMENT_OTHER): Payer: Self-pay

## 2021-07-03 ENCOUNTER — Observation Stay (HOSPITAL_BASED_OUTPATIENT_CLINIC_OR_DEPARTMENT_OTHER): Payer: Commercial Managed Care - PPO

## 2021-07-03 ENCOUNTER — Emergency Department (HOSPITAL_BASED_OUTPATIENT_CLINIC_OR_DEPARTMENT_OTHER): Payer: Commercial Managed Care - PPO

## 2021-07-03 ENCOUNTER — Observation Stay (HOSPITAL_COMMUNITY): Payer: Commercial Managed Care - PPO

## 2021-07-03 DIAGNOSIS — I10 Essential (primary) hypertension: Secondary | ICD-10-CM | POA: Diagnosis not present

## 2021-07-03 DIAGNOSIS — F17210 Nicotine dependence, cigarettes, uncomplicated: Secondary | ICD-10-CM | POA: Insufficient documentation

## 2021-07-03 DIAGNOSIS — E785 Hyperlipidemia, unspecified: Secondary | ICD-10-CM

## 2021-07-03 DIAGNOSIS — R29810 Facial weakness: Secondary | ICD-10-CM | POA: Diagnosis present

## 2021-07-03 DIAGNOSIS — I6381 Other cerebral infarction due to occlusion or stenosis of small artery: Secondary | ICD-10-CM

## 2021-07-03 DIAGNOSIS — I639 Cerebral infarction, unspecified: Secondary | ICD-10-CM | POA: Diagnosis not present

## 2021-07-03 DIAGNOSIS — I6389 Other cerebral infarction: Secondary | ICD-10-CM

## 2021-07-03 DIAGNOSIS — Z20822 Contact with and (suspected) exposure to covid-19: Secondary | ICD-10-CM | POA: Insufficient documentation

## 2021-07-03 DIAGNOSIS — Y900 Blood alcohol level of less than 20 mg/100 ml: Secondary | ICD-10-CM | POA: Diagnosis not present

## 2021-07-03 HISTORY — DX: Hyperlipidemia, unspecified: E78.5

## 2021-07-03 HISTORY — DX: Malignant melanoma of skin, unspecified: C43.9

## 2021-07-03 LAB — COMPREHENSIVE METABOLIC PANEL
ALT: 17 U/L (ref 0–44)
AST: 18 U/L (ref 15–41)
Albumin: 4.2 g/dL (ref 3.5–5.0)
Alkaline Phosphatase: 56 U/L (ref 38–126)
Anion gap: 7 (ref 5–15)
BUN: 15 mg/dL (ref 6–20)
CO2: 28 mmol/L (ref 22–32)
Calcium: 9.3 mg/dL (ref 8.9–10.3)
Chloride: 101 mmol/L (ref 98–111)
Creatinine, Ser: 1.09 mg/dL (ref 0.61–1.24)
GFR, Estimated: >60 mL/min (ref >60)
Glucose, Bld: 94 mg/dL (ref 70–99)
Potassium: 3.5 mmol/L (ref 3.5–5.1)
Sodium: 136 mmol/L (ref 135–145)
Total Bilirubin: 0.5 mg/dL (ref 0.3–1.2)
Total Protein: 6.8 g/dL (ref 6.5–8.1)

## 2021-07-03 LAB — CBC
HCT: 40.7 % (ref 39.0–52.0)
Hemoglobin: 13.6 g/dL (ref 13.0–17.0)
MCH: 30.4 pg (ref 26.0–34.0)
MCHC: 33.4 g/dL (ref 30.0–36.0)
MCV: 91.1 fL (ref 80.0–100.0)
Platelets: 236 10*3/uL (ref 150–400)
RBC: 4.47 MIL/uL (ref 4.22–5.81)
RDW: 12.5 % (ref 11.5–15.5)
WBC: 6.1 10*3/uL (ref 4.0–10.5)
nRBC: 0 % (ref 0.0–0.2)

## 2021-07-03 LAB — URINALYSIS, ROUTINE W REFLEX MICROSCOPIC
Bilirubin Urine: NEGATIVE
Glucose, UA: NEGATIVE mg/dL
Hgb urine dipstick: NEGATIVE
Ketones, ur: NEGATIVE mg/dL
Leukocytes,Ua: NEGATIVE
Nitrite: NEGATIVE
Protein, ur: NEGATIVE mg/dL
Specific Gravity, Urine: <1.005 — ABNORMAL LOW (ref 1.005–1.030)
pH: 6 (ref 5.0–8.0)

## 2021-07-03 LAB — APTT: aPTT: 27 seconds (ref 24–36)

## 2021-07-03 LAB — ECHOCARDIOGRAM COMPLETE
AR max vel: 3.9 cm2
AV Area VTI: 3.6 cm2
AV Area mean vel: 3.7 cm2
AV Mean grad: 3 mmHg
AV Peak grad: 5.4 mmHg
Ao pk vel: 1.16 m/s
Area-P 1/2: 3.48 cm2
Height: 79 in
S' Lateral: 3.3 cm
Weight: 2960 oz

## 2021-07-03 LAB — RAPID URINE DRUG SCREEN, HOSP PERFORMED
Amphetamines: NOT DETECTED
Barbiturates: NOT DETECTED
Benzodiazepines: NOT DETECTED
Cocaine: NOT DETECTED
Opiates: NOT DETECTED
Tetrahydrocannabinol: NOT DETECTED

## 2021-07-03 LAB — CK: Total CK: 60 U/L (ref 49–397)

## 2021-07-03 LAB — ETHANOL: Alcohol, Ethyl (B): <10 mg/dL (ref <10)

## 2021-07-03 LAB — PROTIME-INR
INR: 0.9 (ref 0.8–1.2)
Prothrombin Time: 12.4 seconds (ref 11.4–15.2)

## 2021-07-03 LAB — RESP PANEL BY RT-PCR (FLU A&B, COVID) ARPGX2
Influenza A by PCR: NEGATIVE
Influenza B by PCR: NEGATIVE
SARS Coronavirus 2 by RT PCR: NEGATIVE

## 2021-07-03 IMAGING — MR MR HEAD W/O CM
7 of 11 series · 22 of 48 positions shown · non-contrast
Comparison: Head CT [DATE].

CLINICAL DATA: Stroke follow-up.

EXAM:
MRI HEAD WITHOUT CONTRAST
MRA HEAD WITHOUT CONTRAST
TECHNIQUE: Multiplanar, multi-echo pulse sequences of the brain and surrounding
structures were acquired without intravenous contrast. Angiographic
images of the Circle of Willis were acquired using MRA technique
without intravenous contrast.

[Series 2: DWI · axial · 3.0mm · 0.94mm/px · z∈[-44,+100]mm · 7 of 100 slices shown (1 of 2)]
[im 1/100]
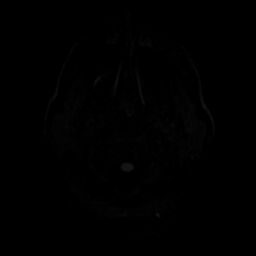
[im 17/100]
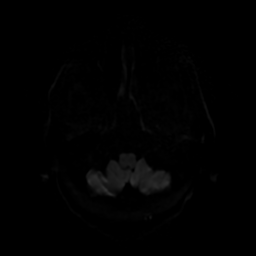
[im 34/100]
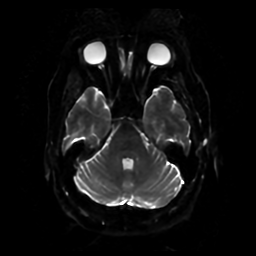
[im 50/100]
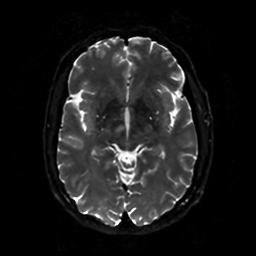
[im 67/100]
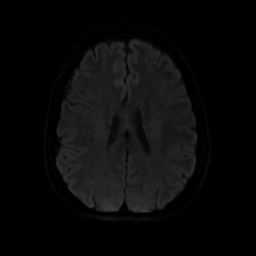
[im 83/100]
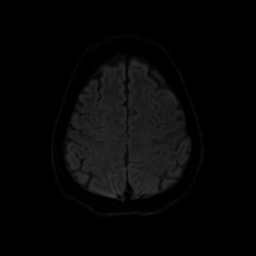
[im 100/100]
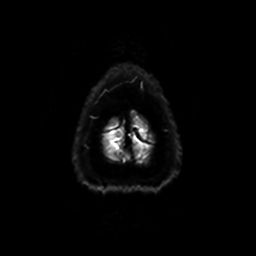

[Series 3: DWI · coronal · 4.0mm · 0.94mm/px · 5 of 74 slices shown (2 of 2)]
[im 1/74]
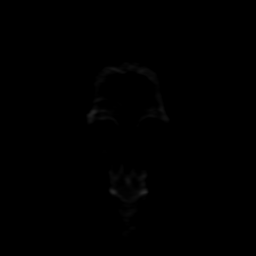
[im 19/74]
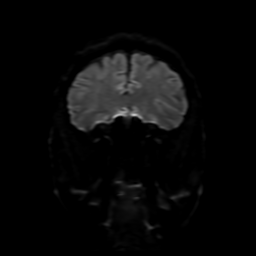
[im 37/74]
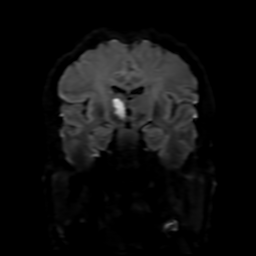
[im 55/74]
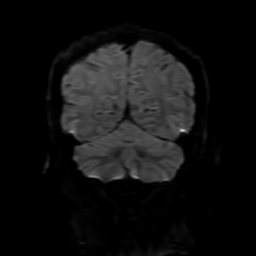
[im 74/74]
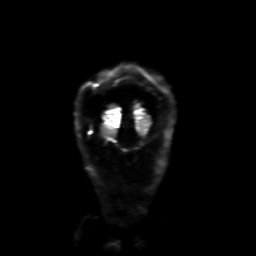

[Series 5: FLAIR · sagittal · 5.0mm · 0.23mm/px · 2 of 23 slices shown (1 of 2)]
[im 1/23]
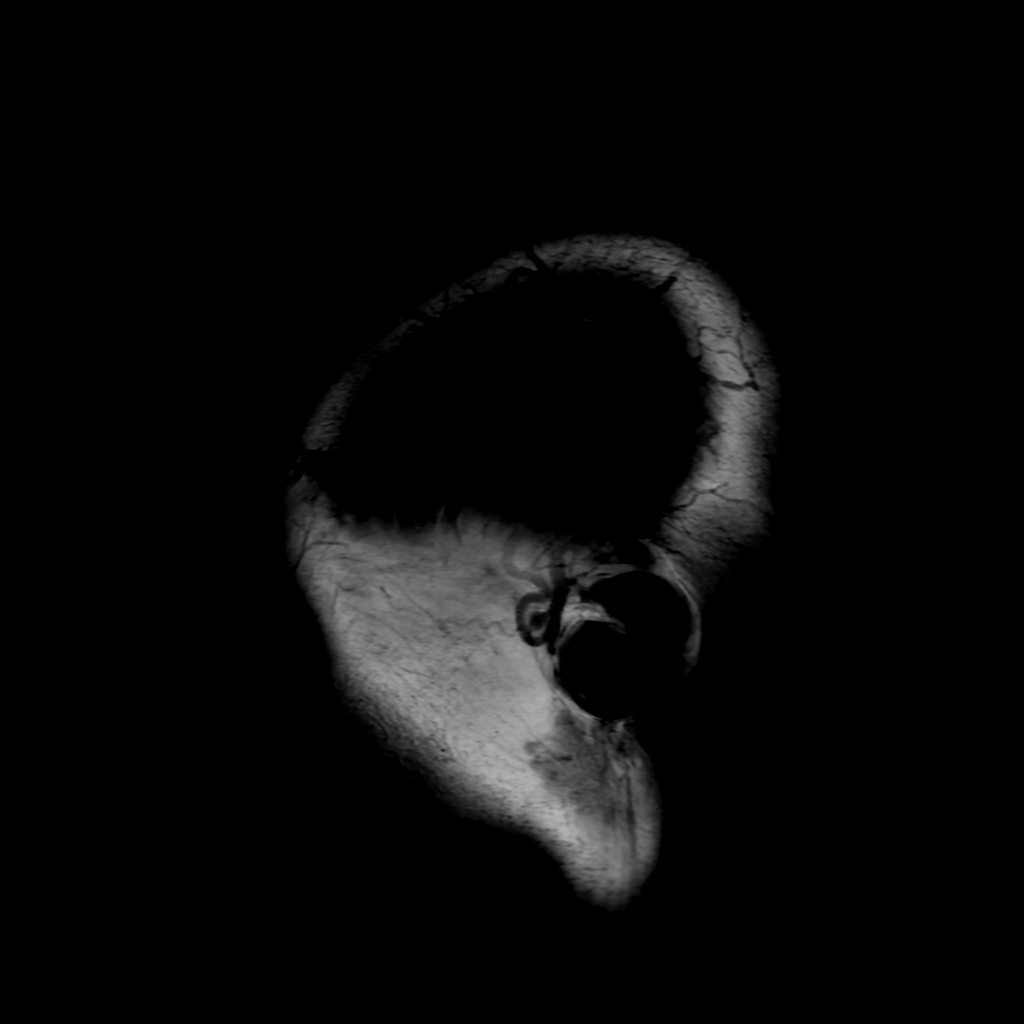
[im 23/23]
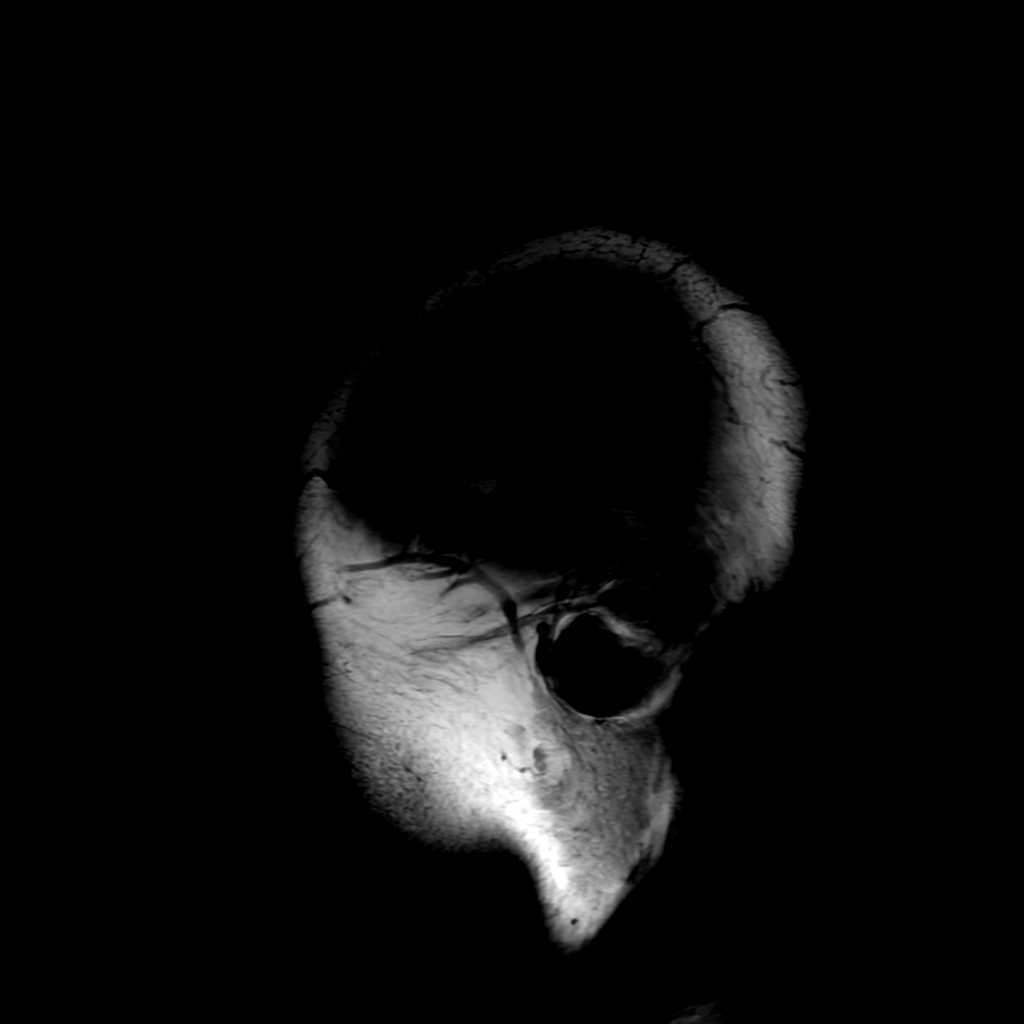

[Series 6: T2 · axial · 5.0mm · 0.23mm/px · 1 of 26 slices shown]
[im 1/26]
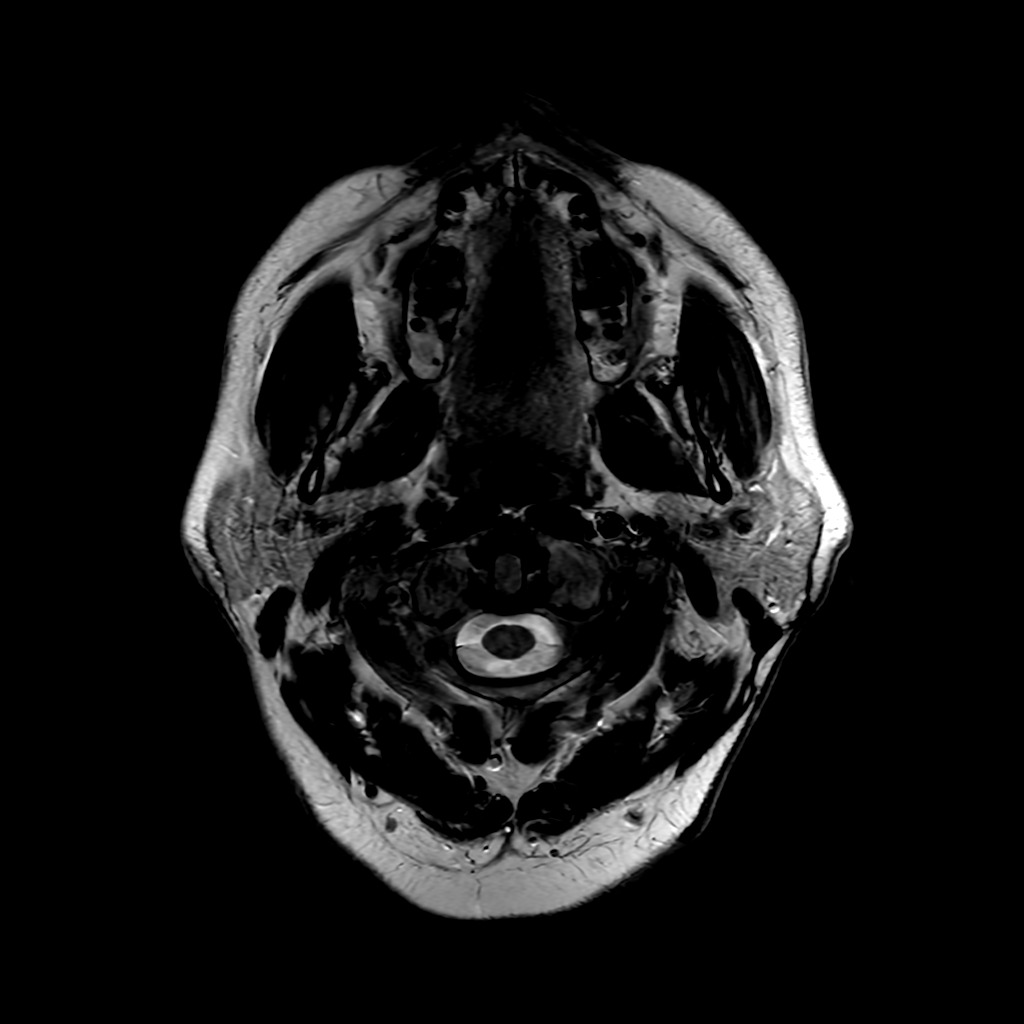

[Series 7: FLAIR · axial · 4.0mm · 0.45mm/px · z∈[-54,+90]mm · 2 of 35 slices shown (2 of 2)]
[im 1/35]
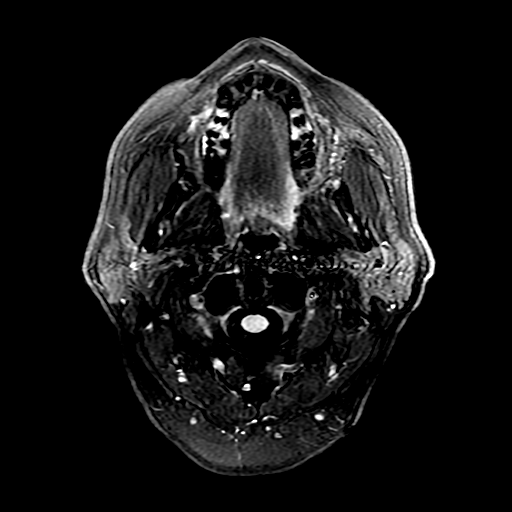
[im 35/35]
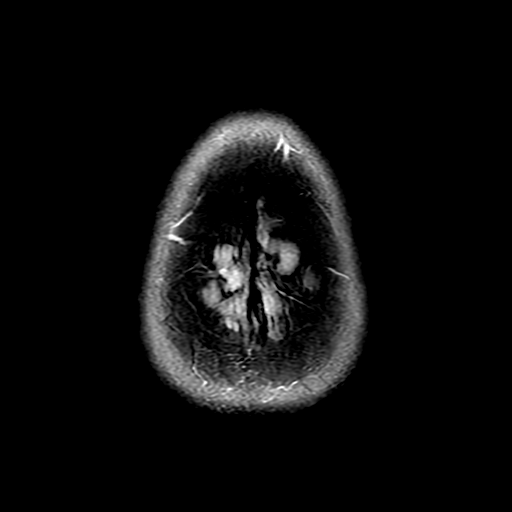

[Series 250: ADC · axial · 3.0mm · 0.94mm/px · z∈[-44,+100]mm · 3 of 50 slices shown (1 of 2)]
[im 1/50]
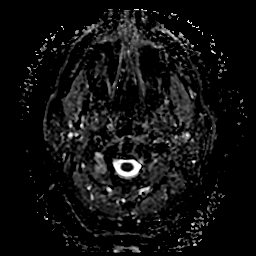
[im 25/50]
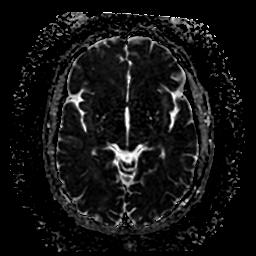
[im 50/50]
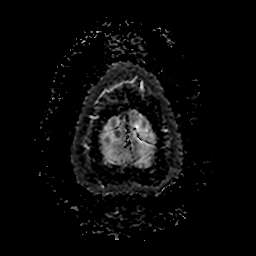

[Series 350: ADC · coronal · 4.0mm · 0.94mm/px · 2 of 35 slices shown (2 of 2)]
[im 1/35]
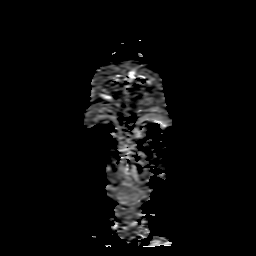
[im 35/35]
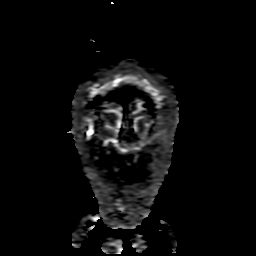

[22 of 48 positions shown; findings below may reference images not displayed]

FINDINGS: MRI HEAD FINDINGS

Brain: Area of restricted diffusion involving the anterior aspect of
the right thalamus, corresponding to hypodensity seen on prior CT.
No other focus of restricted diffusion seen. No hemorrhage,
hydrocephalus, extra-axial collection mass.

Vascular: Normal flow voids.

Skull and upper cervical spine: Normal marrow signal.

Sinuses/Orbits: No acute or significant finding.

Other: None.

MRA HEAD FINDINGS

Anterior circulation: The visualized portions of the distal cervical
and intracranial internal carotid arteries are widely patent with
normal flow related enhancement. The bilateral anterior cerebral
arteries and middle cerebral arteries are widely patent with
antegrade flow without high-grade flow-limiting stenosis or proximal
branch occlusion. Prominent bilateral posterior communicating
arteries. No intracranial aneurysm within the anterior circulation.

Posterior circulation: The vertebral arteries are widely patent with
antegrade flow. The posterior inferior cerebral arteries are normal.
Vertebrobasilar junction and basilar artery are widely patent with
antegrade flow without evidence of basilar stenosis or aneurysm.
Hypoplastic right P1/PCA and severely hypoplastic versus aplastic
left P1/PCA with the bilateral posterior cerebral arteries supplied
predominantly by prominent posterior communicating arteries (fetal
PCAs). No intracranial aneurysm within the posterior circulation.

Anatomic variants: Bilateral fetal PCAs.
IMPRESSION: 1. Acute right thalamic infarct. No evidence of hemorrhagic
transformation.
2. No intracranial large vessel occlusion or hemodynamically
significant stenosis.
3. Bilateral fetal PCAs with hypoplastic right P1/PCA segment and
severely hypoplastic/aplastic left P1/PCA segment.

## 2021-07-03 MED ORDER — ASPIRIN EC 81 MG PO TBEC
81.0000 mg | DELAYED_RELEASE_TABLET | Freq: Every day | ORAL | Status: DC
  Administered 2021-07-04: 81 mg via ORAL
  Filled 2021-07-03: qty 1

## 2021-07-03 MED ORDER — ALUM & MAG HYDROXIDE-SIMETH 200-200-20 MG/5ML PO SUSP
15.0000 mL | Freq: Once | ORAL | Status: AC
  Administered 2021-07-03: 15 mL via ORAL
  Filled 2021-07-03: qty 30

## 2021-07-03 MED ORDER — ATORVASTATIN CALCIUM 40 MG PO TABS
40.0000 mg | ORAL_TABLET | Freq: Every day | ORAL | Status: DC
  Administered 2021-07-03 – 2021-07-04 (×2): 40 mg via ORAL
  Filled 2021-07-03 (×2): qty 1

## 2021-07-03 MED ORDER — IOHEXOL 350 MG/ML SOLN
100.0000 mL | Freq: Once | INTRAVENOUS | Status: AC | PRN
  Administered 2021-07-03: 100 mL via INTRAVENOUS

## 2021-07-03 MED ORDER — SODIUM CHLORIDE 0.9 % IV SOLN: INTRAVENOUS | Status: DC

## 2021-07-03 MED ORDER — ENOXAPARIN SODIUM 40 MG/0.4ML IJ SOSY
40.0000 mg | PREFILLED_SYRINGE | INTRAMUSCULAR | Status: DC
  Administered 2021-07-03 – 2021-07-04 (×2): 40 mg via SUBCUTANEOUS
  Filled 2021-07-03 (×2): qty 0.4

## 2021-07-03 MED ORDER — SODIUM CHLORIDE 0.9 % IV BOLUS
1000.0000 mL | Freq: Once | INTRAVENOUS | Status: AC
Start: 2021-07-03 — End: 2021-07-03
  Administered 2021-07-03: 1000 mL via INTRAVENOUS

## 2021-07-03 MED ORDER — ACETAMINOPHEN 325 MG PO TABS: 650.0000 mg | ORAL_TABLET | ORAL | Status: DC | PRN

## 2021-07-03 MED ORDER — SENNOSIDES-DOCUSATE SODIUM 8.6-50 MG PO TABS: 1.0000 | ORAL_TABLET | Freq: Every evening | ORAL | Status: DC | PRN

## 2021-07-03 MED ORDER — ACETAMINOPHEN 160 MG/5ML PO SOLN: 650.0000 mg | ORAL | Status: DC | PRN

## 2021-07-03 MED ORDER — ASPIRIN 300 MG RE SUPP: 300.0000 mg | Freq: Every day | RECTAL | Status: DC

## 2021-07-03 MED ORDER — STROKE: EARLY STAGES OF RECOVERY BOOK
Freq: Once | Status: AC
  Filled 2021-07-03: qty 1

## 2021-07-03 MED ORDER — ASPIRIN 325 MG PO TABS
325.0000 mg | ORAL_TABLET | Freq: Every day | ORAL | Status: DC
  Administered 2021-07-03: 325 mg via ORAL
  Filled 2021-07-03: qty 1

## 2021-07-03 MED ORDER — ACETAMINOPHEN 650 MG RE SUPP: 650.0000 mg | RECTAL | Status: DC | PRN

## 2021-07-03 NOTE — ED Notes (Signed)
[facility] called for consult for hospitalist and neurology

## 2021-07-03 NOTE — ED Notes (Signed)
Pt sleeping, wife at bedside.  Provided update that report was called by night shift RN to [facility] at [place], [facility] has been contacted for transport.  Awaiting call from [facility] for ETA.  I will keep her updated with status of [facility] arrival.

## 2021-07-03 NOTE — H&P (Signed)
History and Physical    Martin Robinson [id number] DOB: February 06, 1961 DOA: 07/03/2021  PCP: Eunice Blase, MD/[facility] Consultants:  [facility] - dermatology Patient coming from:  Home - lives with wife; NOK: Wife, [phone]  Chief Complaint: Neurologic symptoms  HPI: Martin Robinson is a 60 y.o. male with medical history significant of skin cancers presenting with dysarthria and L facial droop.  They were visiting their son in [place].  Monday night things were fine.  They got up the next AM - he slept late which is out of character for him.  He laid around, mild L eyelid droop.  He did walk a bit off balance.  He leaned to the left and walked diagonally.  He toppled over on the sidewalk.  He was slurring speech with L lip drooping and slurred speech.  He went to the ER there and then drove 3 hours to another hospital.  Labs looked good.  CT was read as negative - but was present.  His symptoms had mostly resolved - mild ataxia and lip droop.  They thought probably a TIA.  However, on Wednesday he was still ataxic, fell once, more somnolent than usual.  They arranged to come home early, got into [facility] at midnight and came straight to ER.  His symptoms are now mostly resolved with mild ataxia as a residual.    ED Course:  MCDB to [facility], per Dr. Myna Hidalgo:  Accepted from [facility] for subacute right thalamic lacunar infarct. 60 yr old male who denies significant PMHx presents with dysarthria and left facial droop since 8/23 and found to have infarct on CT. He was in [place] when sxs started, had negative CT there and returned to [place] now to get further assessment. ED spoke with neuro (Dr. Cheral Marker) who will consult.    Review of Systems: As per HPI; otherwise review of systems reviewed and negative.   Ambulatory Status:  Ambulates without assistance  COVID Vaccine Status:  None  Past Medical History:  Diagnosis Date   Dyslipidemia 07/03/2021   Melanoma [facility])     Past Surgical  History:  Procedure Laterality Date   MELANOMA EXCISION      Social History   Socioeconomic History   Marital status: Married    Spouse name: Not on file   Number of children: Not on file   Years of education: Not on file   Highest education level: Not on file  Occupational History   Occupation: insurance  Tobacco Use   Smoking status: Former    Types: Cigarettes, Cigars   Smokeless tobacco: Former   Tobacco comments:    Cigars, nicotine packets  Substance and Sexual Activity   Alcohol use: Yes    Alcohol/week: 10.0 standard drinks    Types: 10 Standard drinks or equivalent per week    Comment: " bunch"   Drug use: Never   Sexual activity: Never  Other Topics Concern   Not on file  Social History Narrative   Not on file   Social Determinants of Health   Financial Resource Strain: Not on file  Food Insecurity: Not on file  Transportation Needs: Not on file  Physical Activity: Not on file  Stress: Not on file  Social Connections: Not on file  Intimate Partner Violence: Not on file    No Known Allergies  Family History  Problem Relation Age of Onset   Cancer Mother    Cancer Father    Diabetes Father    CAD Father  Stroke Paternal Uncle        all died in 66s/60s   CAD Paternal Uncle     Prior to Admission medications   Medication Sig Start Date End Date Taking? Authorizing Provider  co-enzyme Q-10 30 MG capsule Take 30 mg by mouth daily.   Yes [provider]  glucosamine-chondroitin 500-400 MG tablet Take 1 tablet by mouth 3 (three) times daily.   Yes [provider]  Inositol Niacinate (NIACIN FLUSH FREE PO) Take 500 mg by mouth daily.   Yes [provider]  Multiple Vitamins-Minerals (MULTI FOR HIM 50+) TABS Take 1 tablet by mouth daily.   Yes [provider]  Omega-3 Fatty Acids (FISH OIL) 1000 MG CAPS Take 1,000 mg by mouth daily.   Yes [provider]  OVER THE COUNTER MEDICATION Take 6 tablets by mouth  daily. 3 fruit & 3 veggie complete balance vitamins   Yes [provider]    Physical Exam: Vitals:   07/03/21 0742 07/03/21 0858 07/03/21 1116 07/03/21 1558  BP: 130/90 117/81 115/82 115/70  Pulse: 71 69 78 70  Resp: '17  18 20  '$ Temp: 98.3 F (36.8 C) 98.4 F (36.9 C) 98.3 F (36.8 C) 98.4 F (36.9 C)  TempSrc: Oral Oral Oral Oral  SpO2: 98% 100% 99% 98%  Weight:      Height:         General:  Appears calm and comfortable and is in NAD Eyes:  PERRL, EOMI, normal lids, iris ENT:  grossly normal hearing, lips & tongue, mmm; appropriate dentition Neck:  no LAD, masses or thyromegaly; no carotid bruits Cardiovascular:  RRR, no m/r/g. No LE edema.  Respiratory:   CTA bilaterally with no wheezes/rales/rhonchi.  Normal respiratory effort. Abdomen:  soft, NT, ND Skin:  no rash or induration seen on limited exam Musculoskeletal:  grossly normal tone BUE/BLE, good ROM, no bony abnormality Psychiatric:  grossly normal mood and affect, speech fluent and appropriate, AOx3 Neurologic:  CN 2-12 grossly intact, moves all extremities in coordinated fashion, sensation intact    Radiological Exams on Admission: Independently reviewed - see discussion in A/P where applicable  CT Angio Head W or Wo Contrast  Addendum Date: 07/03/2021   ADDENDUM REPORT: 07/03/2021 04:53 ADDENDUM: Study discussed by telephone with Dr. Addison Lank on 07/03/2021 at 0446 hours. Electronically Signed   By: Genevie Ann M.D.   On: 07/03/2021 04:53   Result Date: 07/03/2021 CLINICAL DATA:  60 year old male with altered mental status three days after a fall. Slurred speech. EXAM: CT ANGIOGRAPHY HEAD AND NECK TECHNIQUE: Multidetector CT imaging of the head and neck was performed using the standard protocol during bolus administration of intravenous contrast. Multiplanar CT image reconstructions and MIPs were obtained to evaluate the vascular anatomy. Carotid stenosis measurements (when applicable) are obtained  utilizing NASCET criteria, using the distal internal carotid diameter as the denominator. CONTRAST:  164m OMNIPAQUE IOHEXOL 350 MG/ML SOLN COMPARISON:  None. FINDINGS: CT HEAD Brain: Conspicuous oval hypodensity in the anterior right thalamus measures about 10 mm diameter and 2 cm in length (series 3, image 13 and series 9, image 25). No associated hemorrhage. No regional mass effect. Elsewhere gray-white matter differentiation is within normal limits. No midline shift, ventriculomegaly, mass effect, evidence of mass lesion, intracranial hemorrhage or evidence of cortically based acute infarction. Calvarium and skull base: Intact, negative. Paranasal sinuses: Visualized paranasal sinuses and mastoids are clear. Orbits: Visualized orbits and scalp soft tissues are within normal limits. CTA NECK  Skeleton: No acute osseous abnormality identified. Minimal degenerative changes in the visible spine. Upper chest: Negative. Other neck: Negative. Aortic arch: 3 vessel arch configuration.  No arch atherosclerosis. Right carotid system: Negative right CCA and right carotid bifurcation. Tortuous right ICA distal to the bulb, but no plaque or stenosis to the skull base. Left carotid system: Negative. Vertebral arteries: Normal proximal right subclavian artery and right vertebral artery origin. Right vertebral artery is diminutive and appears non dominant throughout the neck but remains patent to the skull base with no plaque or stenosis identified. Proximal left subclavian artery and left vertebral artery origin are normal. The left vertebral artery appears dominant, but with a relatively normal caliber. The left vertebral is patent to the skull base without plaque or stenosis. CTA HEAD Posterior circulation: Patent distal vertebral arteries to the vertebrobasilar junction without plaque or stenosis. Dominant left V4. Mildly fenestrated vertebrobasilar junction (normal variant). Patent right PICA origin. Left AICA may be  dominant and appears patent. Patent although diminutive basilar artery which functionally terminates at the SCA is. There are fetal type bilateral PCA origins. But furthermore, there is evidence of a small right PCA P1 segment arising from the basilar tip but abruptly terminating as seen on series 15, image 21. Bilateral PCA branches otherwise appear within normal limits. Anterior circulation: Both ICA siphons are patent with no plaque or stenosis. Bilateral ophthalmic and posterior communicating artery origins are normal. Patent carotid termini. Normal MCA and ACA origins. Normal anterior communicating artery. Bilateral ACA branches are within normal limits. Left MCA M1 segment, bifurcation and branches are within normal limits. Right MCA M1 segment, bifurcation and branches are within normal limits. Venous sinuses: Patent. Anatomic variants: Diminutive vertebrobasilar system on the basis of fetal type bilateral PCA origins. Dominant left and diminutive right vertebral arteries. Review of the MIP images confirms the above findings IMPRESSION: 1. Positive for a Right Thalamic lacunar infarct. No associated hemorrhage or mass effect. Although age indeterminate by CT, this is most likely the symptomatic abnormality as CTA further demonstrates a partially occluded Right PCA P1 segment (non-dominant due to fetal PCA origins) correlating with Right Thalamostriate Artery ischemia. 2. But otherwise normal arterial findings in the head and neck. No atherosclerosis or atherosclerotic stenosis identified. 3. And otherwise negative CT appearance of the brain. Electronically Signed: By: Genevie Ann M.D. On: 07/03/2021 04:41   CT Angio Neck W and/or Wo Contrast  Addendum Date: 07/03/2021   ADDENDUM REPORT: 07/03/2021 04:53 ADDENDUM: Study discussed by telephone with Dr. Addison Lank on 07/03/2021 at 0446 hours. Electronically Signed   By: Genevie Ann M.D.   On: 07/03/2021 04:53   Result Date: 07/03/2021 CLINICAL DATA:  60 year old  male with altered mental status three days after a fall. Slurred speech. EXAM: CT ANGIOGRAPHY HEAD AND NECK TECHNIQUE: Multidetector CT imaging of the head and neck was performed using the standard protocol during bolus administration of intravenous contrast. Multiplanar CT image reconstructions and MIPs were obtained to evaluate the vascular anatomy. Carotid stenosis measurements (when applicable) are obtained utilizing NASCET criteria, using the distal internal carotid diameter as the denominator. CONTRAST:  114m OMNIPAQUE IOHEXOL 350 MG/ML SOLN COMPARISON:  None. FINDINGS: CT HEAD Brain: Conspicuous oval hypodensity in the anterior right thalamus measures about 10 mm diameter and 2 cm in length (series 3, image 13 and series 9, image 25). No associated hemorrhage. No regional mass effect. Elsewhere gray-white matter differentiation is within normal limits. No midline shift, ventriculomegaly, mass effect, evidence of mass lesion,  intracranial hemorrhage or evidence of cortically based acute infarction. Calvarium and skull base: Intact, negative. Paranasal sinuses: Visualized paranasal sinuses and mastoids are clear. Orbits: Visualized orbits and scalp soft tissues are within normal limits. CTA NECK Skeleton: No acute osseous abnormality identified. Minimal degenerative changes in the visible spine. Upper chest: Negative. Other neck: Negative. Aortic arch: 3 vessel arch configuration.  No arch atherosclerosis. Right carotid system: Negative right CCA and right carotid bifurcation. Tortuous right ICA distal to the bulb, but no plaque or stenosis to the skull base. Left carotid system: Negative. Vertebral arteries: Normal proximal right subclavian artery and right vertebral artery origin. Right vertebral artery is diminutive and appears non dominant throughout the neck but remains patent to the skull base with no plaque or stenosis identified. Proximal left subclavian artery and left vertebral artery origin are  normal. The left vertebral artery appears dominant, but with a relatively normal caliber. The left vertebral is patent to the skull base without plaque or stenosis. CTA HEAD Posterior circulation: Patent distal vertebral arteries to the vertebrobasilar junction without plaque or stenosis. Dominant left V4. Mildly fenestrated vertebrobasilar junction (normal variant). Patent right PICA origin. Left AICA may be dominant and appears patent. Patent although diminutive basilar artery which functionally terminates at the SCA is. There are fetal type bilateral PCA origins. But furthermore, there is evidence of a small right PCA P1 segment arising from the basilar tip but abruptly terminating as seen on series 15, image 21. Bilateral PCA branches otherwise appear within normal limits. Anterior circulation: Both ICA siphons are patent with no plaque or stenosis. Bilateral ophthalmic and posterior communicating artery origins are normal. Patent carotid termini. Normal MCA and ACA origins. Normal anterior communicating artery. Bilateral ACA branches are within normal limits. Left MCA M1 segment, bifurcation and branches are within normal limits. Right MCA M1 segment, bifurcation and branches are within normal limits. Venous sinuses: Patent. Anatomic variants: Diminutive vertebrobasilar system on the basis of fetal type bilateral PCA origins. Dominant left and diminutive right vertebral arteries. Review of the MIP images confirms the above findings IMPRESSION: 1. Positive for a Right Thalamic lacunar infarct. No associated hemorrhage or mass effect. Although age indeterminate by CT, this is most likely the symptomatic abnormality as CTA further demonstrates a partially occluded Right PCA P1 segment (non-dominant due to fetal PCA origins) correlating with Right Thalamostriate Artery ischemia. 2. But otherwise normal arterial findings in the head and neck. No atherosclerosis or atherosclerotic stenosis identified. 3. And  otherwise negative CT appearance of the brain. Electronically Signed: By: Genevie Ann M.D. On: 07/03/2021 04:41   MR ANGIO HEAD WO CONTRAST  Result Date: 07/03/2021 CLINICAL DATA:  Stroke follow-up. EXAM: MRI HEAD WITHOUT CONTRAST MRA HEAD WITHOUT CONTRAST TECHNIQUE: Multiplanar, multi-echo pulse sequences of the brain and surrounding structures were acquired without intravenous contrast. Angiographic images of the Circle of Willis were acquired using MRA technique without intravenous contrast. COMPARISON:  Head CT July 03, 2021. FINDINGS: MRI HEAD FINDINGS Brain: Area of restricted diffusion involving the anterior aspect of the right thalamus, corresponding to hypodensity seen on prior CT. No other focus of restricted diffusion seen. No hemorrhage, hydrocephalus, extra-axial collection mass. Vascular: Normal flow voids. Skull and upper cervical spine: Normal marrow signal. Sinuses/Orbits: No acute or significant finding. Other: None. MRA HEAD FINDINGS Anterior circulation: The visualized portions of the distal cervical and intracranial internal carotid arteries are widely patent with normal flow related enhancement. The bilateral anterior cerebral arteries and middle cerebral arteries are widely patent  with antegrade flow without high-grade flow-limiting stenosis or proximal branch occlusion. Prominent bilateral posterior communicating arteries. No intracranial aneurysm within the anterior circulation. Posterior circulation: The vertebral arteries are widely patent with antegrade flow. The posterior inferior cerebral arteries are normal. Vertebrobasilar junction and basilar artery are widely patent with antegrade flow without evidence of basilar stenosis or aneurysm. Hypoplastic right P1/PCA and severely hypoplastic versus aplastic left P1/PCA with the bilateral posterior cerebral arteries supplied predominantly by prominent posterior communicating arteries (fetal PCAs). No intracranial aneurysm within the  posterior circulation. Anatomic variants: Bilateral fetal PCAs. IMPRESSION: 1. Acute right thalamic infarct. No evidence of hemorrhagic transformation. 2. No intracranial large vessel occlusion or hemodynamically significant stenosis. 3. 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 Electronically Signed   By: Pedro Earls M.D.   On: 07/03/2021 14:52   MR BRAIN WO CONTRAST  Result Date: 07/03/2021 CLINICAL DATA:  Stroke follow-up. EXAM: MRI HEAD WITHOUT CONTRAST MRA HEAD WITHOUT CONTRAST TECHNIQUE: Multiplanar, multi-echo pulse sequences of the brain and surrounding structures were acquired without intravenous contrast. Angiographic images of the Circle of Willis were acquired using MRA technique without intravenous contrast. COMPARISON:  Head CT July 03, 2021. FINDINGS: MRI HEAD FINDINGS Brain: Area of restricted diffusion involving the anterior aspect of the right thalamus, corresponding to hypodensity seen on prior CT. No other focus of restricted diffusion seen. No hemorrhage, hydrocephalus, extra-axial collection mass. Vascular: Normal flow voids. Skull and upper cervical spine: Normal marrow signal. Sinuses/Orbits: No acute or significant finding. Other: None. MRA HEAD FINDINGS Anterior circulation: The visualized portions of the distal cervical and intracranial internal carotid arteries are widely patent with normal flow related enhancement. The bilateral anterior cerebral arteries and middle cerebral arteries are widely patent with antegrade flow without high-grade flow-limiting stenosis or proximal branch occlusion. Prominent bilateral posterior communicating arteries. No intracranial aneurysm within the anterior circulation. Posterior circulation: The vertebral arteries are widely patent with antegrade flow. The posterior inferior cerebral arteries are normal. Vertebrobasilar junction and basilar artery are widely  patent with antegrade flow without evidence of basilar stenosis or aneurysm. Hypoplastic right P1/PCA and severely hypoplastic versus aplastic left P1/PCA with the bilateral posterior cerebral arteries supplied predominantly by prominent posterior communicating arteries (fetal PCAs). No intracranial aneurysm within the posterior circulation. Anatomic variants: Bilateral fetal PCAs. IMPRESSION: 1. Acute right thalamic infarct. No evidence of hemorrhagic transformation. 2. No intracranial large vessel occlusion or hemodynamically significant stenosis. 3. 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 Electronically Signed   By: Pedro Earls M.D.   On: 07/03/2021 14:52   ECHOCARDIOGRAM COMPLETE  Result Date: 07/03/2021    ECHOCARDIOGRAM REPORT   Patient Name:   MARQUAN HAGIN Date of Exam: 07/03/2021 Medical Rec #:  [id number]         Height:       79.0 in Accession #:    [id number]        Weight:       185.0 lb Date of Birth:  [birth year]          BSA:          2.205 m Patient Age:    43 years          BP:           115/82 mmHg Patient Gender: M                 HR:  72 bpm. Exam Location:  Inpatient Procedure: 2D Echo, Cardiac Doppler and Color Doppler Indications:    Stroke  History:        Patient has no prior history of Echocardiogram examinations.  Sonographer:    Merrie Roof RDCS Referring Phys: [place]  1. Left ventricular ejection fraction, by estimation, is 60 to 65%. The left ventricle has normal function. The left ventricle has no regional wall motion abnormalities. There is mild left ventricular hypertrophy. Left ventricular diastolic parameters were normal.  2. Right ventricular systolic function is normal. The right ventricular size is normal. Tricuspid regurgitation signal is inadequate for assessing PA pressure.  3. The mitral valve is normal in structure. Trivial mitral valve  regurgitation. No evidence of mitral stenosis.  4. The aortic valve is tricuspid. Aortic valve regurgitation is not visualized. No aortic stenosis is present.  5. The inferior vena cava is normal in size with greater than 50% respiratory variability, suggesting right atrial pressure of 3 mmHg. Conclusion(s)/Recommendation(s): No intracardiac source of embolism detected on this transthoracic study. A transesophageal echocardiogram is recommended to exclude cardiac source of embolism if clinically indicated. FINDINGS  Left Ventricle: Left ventricular ejection fraction, by estimation, is 60 to 65%. The left ventricle has normal function. The left ventricle has no regional wall motion abnormalities. The left ventricular internal cavity size was normal in size. There is  mild left ventricular hypertrophy. Left ventricular diastolic parameters were normal. Right Ventricle: The right ventricular size is normal. No increase in right ventricular wall thickness. Right ventricular systolic function is normal. Tricuspid regurgitation signal is inadequate for assessing PA pressure. Left Atrium: Left atrial size was normal in size. Right Atrium: Right atrial size was normal in size. Pericardium: There is no evidence of pericardial effusion. Mitral Valve: The mitral valve is normal in structure. Trivial mitral valve regurgitation. No evidence of mitral valve stenosis. Tricuspid Valve: The tricuspid valve is normal in structure. Tricuspid valve regurgitation is trivial. No evidence of tricuspid stenosis. Aortic Valve: The aortic valve is tricuspid. Aortic valve regurgitation is not visualized. No aortic stenosis is present. Aortic valve mean gradient measures 3.0 mmHg. Aortic valve peak gradient measures 5.4 mmHg. Aortic valve area, by VTI measures 3.60 cm. Pulmonic Valve: The pulmonic valve was normal in structure. Pulmonic valve regurgitation is trivial. No evidence of pulmonic stenosis. Aorta: The aortic root is normal in size  and structure. Venous: The inferior vena cava is normal in size with greater than 50% respiratory variability, suggesting right atrial pressure of 3 mmHg. IAS/Shunts: No atrial level shunt detected by color flow Doppler.  LEFT VENTRICLE PLAX 2D LVIDd:         5.00 cm  Diastology LVIDs:         3.30 cm  LV e' medial:    10.30 cm/s LV PW:         1.20 cm  LV E/e' medial:  6.2 LV IVS:        0.80 cm  LV e' lateral:   13.10 cm/s LVOT diam:     2.30 cm  LV E/e' lateral: 4.8 LV SV:         86 LV SV Index:   39 LVOT Area:     4.15 cm  RIGHT VENTRICLE RV Basal diam:  3.70 cm RV S prime:     11.90 cm/s TAPSE (M-mode): 2.0 cm LEFT ATRIUM             Index  RIGHT ATRIUM           Index LA diam:        3.90 cm 1.77 cm/m  RA Area:     16.30 cm LA Vol (A2C):   53.1 ml 24.09 ml/m RA Volume:   39.40 ml  17.87 ml/m LA Vol (A4C):   43.4 ml 19.69 ml/m LA Biplane Vol: 50.3 ml 22.82 ml/m  AORTIC VALVE AV Area (Vmax):    3.90 cm AV Area (Vmean):   3.70 cm AV Area (VTI):     3.60 cm AV Vmax:           116.00 cm/s AV Vmean:          78.300 cm/s AV VTI:            0.240 m AV Peak Grad:      5.4 mmHg AV Mean Grad:      3.0 mmHg LVOT Vmax:         109.00 cm/s LVOT Vmean:        69.700 cm/s LVOT VTI:          0.208 m LVOT/AV VTI ratio: 0.87  AORTA Ao Root diam: 3.30 cm Ao Asc diam:  3.00 cm MITRAL VALVE MV Area (PHT): 3.48 cm    SHUNTS MV Decel Time: 218 msec    Systemic VTI:  0.21 m MV E velocity: 63.40 cm/s  Systemic Diam: 2.30 cm MV A velocity: 58.20 cm/s MV E/A ratio:  1.09 Cherlynn Kaiser MD Electronically signed by Cherlynn Kaiser MD Signature Date/Time: 07/03/2021/2:54:53 PM    Final     EKG: Independently reviewed.  NSR with rate 74; no evidence of acute ischemia   Labs on Admission: I have personally reviewed the available labs and imaging studies at the time of the admission.  Pertinent labs:   Normal CMP Normal CBC INR 0.9 COVID/flu negative UA unremarkable ETOH <10 UDS  negative   Assessment/Plan Principal Problem:   Ischemic stroke (HCC) Active Problems:   Dyslipidemia   CVA -Patient without significant reported CVD RF (actually has HLD, see below) presenting with subacute onset of ataxia, slurred speech and facial droop; [place] and so access to care was delayed and symptoms have mostly resolved now -Aspirin has been given to reduce stroke mortality and decrease morbidity -Will place observation status for further CVA evaluation -Telemetry monitoring -MRI/MRA -Echo -If the patient does not have known afib and this is not detected on telemetry during hospitalization, consider outpatient Holter monitoring and/or loop recorder placement. -However, this was an acute thalamic infarct and he does have PCA occlusion that likely led to event -Risk stratification with FLP, A1c; will also check UDS -Patient will likely need DAPT for 21 days and then can transition to monotherapy with a single antiplatelet agent.  Will defer to neurology for now. -Neurology consult -PT/OT/ST/Nutrition Consults   HLD -Check FLP -Review of outpatient records shows high HDL and good ratio, but LDL up to 160 and consistently >140 with goal now <70 -He has generally been statin intolerant -For now will start Lipitor 40 mg daily -Needs outpatient referral to lipid clinic       Note: This patient has been tested and is negative for the novel coronavirus COVID-19. He has NOT been vaccinated against COVID-19.    DVT prophylaxis:  Lovenox  Code Status: DNR - confirmed with patient/family Family Communication: Wife (RN at [employer]), Daughter present throughout evaluation Disposition Plan:  The  patient is from: home  Anticipated d/c is to: home without [facility] services   Anticipated d/c date will depend on clinical response to treatment, but possibly as early as tomorrow if he has excellent response to treatment  Patient is  currently: acutely ill Consults called: Neurology; PT/OT/ST/Nutrition; [facility] team Admission status: It is my clinical opinion that referral for OBSERVATION is reasonable and necessary in this patient based on the above information provided. The aforementioned taken together are felt to place the patient at high risk for further clinical deterioration. However it is anticipated that the patient may be medically stable for discharge from the hospital within 24 to 48 hours.      Karmen Bongo MD [facility]   How to contact the [facility] Attending or Consulting provider [place] or covering provider during after hours [place], for this patient?  Check the care team in [facility] and look for a) attending/consulting [facility] provider listed and b) the [facility] team listed Log into [email or web address] and use [place]'s universal password to access. If you do not have the password, please contact the hospital operator. Locate the [facility] provider you are looking for under [facility] and page to a number that you can be directly reached. If you still have difficulty reaching the provider, please page the [facility] (Director on Call) for the Hospitalists listed on amion for assistance.   07/03/2021, 6:47 PM

## 2021-07-03 NOTE — Consult Note (Signed)
Neurology Consultation  Reason for Consult: Subacute right thalamic lacunar infarct  Referring Physician: Dr. Leonette Monarch  CC: Transient leftward leaning gait and left facial droop, imbalance, incoordination  History is obtained from: Patient, Chart review  HPI: Martin Robinson is a 60 y.o. male with a medical history significant for melanoma who presented to the ED for evaluation of transient dysarthria, left facial droop, incoordination, and leftward leaning gait. He and his wife were in [place] visiting family when Martin Robinson woke up on Tuesday, August 23 after sleeping in with symptoms of lethargy, imbalance, incoordination, slurred speech, and left facial droop. He states that while he was walking he felt like a magnet was pulling him to the left and he went to step up on a 3 foot curb and fell into the street. His wife is an ER nurse and took him to the hospital with unremarkable labs and a head CT that was officially read as "negative" (review of films brought back from [place] reveals a right thalamic hypodensity at the same location as the lesion seen on CT subsequently obtained in the [place]) with most of his symptoms resolved, with only mild incoordination persisting. They thought he may have had a TIA. When he woke up on Wednesday however, he was still ataxic and more lethargic than baseline, so they decided to come home early for further evaluation in the [place]   LKW: Monday, June 29, 2021 tpa given?: no, outside of thrombolytic therapy window IR Thrombectomy? No, out of time window Modified Rankin Scale: 0-Completely asymptomatic and back to baseline post- stroke  ROS: A complete ROS was performed and is negative except as noted in the HPI.   Past Medical History:  Diagnosis Date   Melanoma [facility])    Past Surgical History:  Procedure Laterality Date   MELANOMA EXCISION     Family History  Problem Relation Age of Onset   Cancer Mother    Cancer Father     Diabetes Father    CAD Father    Stroke Paternal Uncle        all died in 63s/60s   CAD Paternal Uncle    Social History:   reports that he has quit smoking. His smoking use included cigarettes and cigars. He has quit using smokeless tobacco. He reports current alcohol use of about 10.0 standard drinks per week. He reports that he does not use drugs.  Medications  Current Facility-Administered Medications:    0.9 %  sodium chloride infusion, , Intravenous, Continuous, Karmen Bongo, MD, Last Rate: 50 mL/hr at 07/03/21 1202, New Bag at 07/03/21 1202   acetaminophen (TYLENOL) tablet 650 mg, 650 mg, Oral, Q4H PRN **OR** acetaminophen (TYLENOL) 160 MG/5ML solution 650 mg, 650 mg, Per Tube, Q4H PRN **OR** acetaminophen (TYLENOL) suppository 650 mg, 650 mg, Rectal, Q4H PRN, Karmen Bongo, MD   aspirin suppository 300 mg, 300 mg, Rectal, Daily **OR** aspirin tablet 325 mg, 325 mg, Oral, Daily, Karmen Bongo, MD, 325 mg at 07/03/21 1201   enoxaparin (LOVENOX) injection 40 mg, 40 mg, Subcutaneous, Q24H, Karmen Bongo, MD, 40 mg at 07/03/21 1202   senna-docusate (Senokot-S) tablet 1 tablet, 1 tablet, Oral, QHS PRN, Karmen Bongo, MD  Exam: Current vital signs: BP 115/70 (BP Location: Right Arm)   Pulse 70   Temp 98.4 F (36.9 C) (Oral) Comment: pt is back from MRI  Resp 20   Ht '6\' 7"'$  (2.007 m)   Wt 83.9 kg   SpO2 98%   BMI  20.84 kg/m  Vital signs in last 24 hours: Temp:  [98 F (36.7 C)-98.4 F (36.9 C)] 98.4 F (36.9 C) (08/26 1558) Pulse Rate:  [65-78] 70 (08/26 1558) Resp:  [16-20] 20 (08/26 1558) BP: (115-138)/(70-95) 115/70 (08/26 1558) SpO2:  [98 %-100 %] 98 % (08/26 1558) Weight:  [83.9 kg] 83.9 kg (08/26 0237)  GENERAL: Awake, alert, in no acute distress Psych: Affect appropriate for situation, patient is calm and cooperative with examination Head: Normocephalic and atraumatic, without obvious abnormality EENT: Normal conjunctivae, dry mucous membranes, no OP  obstruction LUNGS: Normal respiratory effort. Non-labored breathing on room air CV: Extremities well perfused, no pedal edema ABDOMEN: Soft, non-tender, non-distended Extremities: warm, without obvious deformity  NEURO:  Mental Status: Awake, alert, and oriented to person, place, time, and situation. He is able to provide a clear and coherent history of present illness. Speech/Language: speech is intact without dysarthria Naming, repetition, fluency, and comprehension intact without aphasia  No neglect is noted Cranial Nerves:  II: PERRL 3 mm/brisk. Visual fields full.  III, IV, VI: EOMI without ptosis, gaze preference, or nystagmus V: Sensation is intact to light touch and symmetrical to face.  VII: Face is symmetric resting and smiling.  VIII: Hearing is intact to voice IX, X: Palate elevation is symmetric. Phonation normal.  XI: Normal sternocleidomastoid and trapezius muscle strength XII: Tongue protrudes midline without fasciculations.   Motor: 5/5 strength present in bilateral upper and lower extremities without noted asymmetry.  Tone is normal. Bulk is normal.  Sensation: Intact to light touch bilaterally in all four extremities.  Coordination: Mild dysmetria noted with FNF on the left  DTRs: 2+ and symmetric patellae, biceps, and brachioradialis Gait: Deferred  NIHSS: 1a Level of Conscious.: 0 1b LOC Questions: 0 1c LOC Commands: 0 2 Best Gaze: 0 3 Visual: 0 4 Facial Palsy: 0 5a Motor Arm - left: 0 5b Motor Arm - Right: 0 6a Motor Leg - Left: 0 6b Motor Leg - Right: 0 7 Limb Ataxia: 1 8 Sensory: 0 9 Best Language: 0 10 Dysarthria: 0 11 Extinct. and Inatten.: 0 TOTAL: 1  Labs I have reviewed labs in epic and the results pertinent to this consultation are: CBC    Component Value Date/Time   WBC 6.1 07/03/2021 0240   RBC 4.47 07/03/2021 0240   HGB 13.6 07/03/2021 0240   HCT 40.7 07/03/2021 0240   PLT 236 07/03/2021 0240   MCV 91.1 07/03/2021 0240   MCH  30.4 07/03/2021 0240   MCHC 33.4 07/03/2021 0240   RDW 12.5 07/03/2021 0240   CMP     Component Value Date/Time   NA 136 07/03/2021 0240   K 3.5 07/03/2021 0240   CL 101 07/03/2021 0240   CO2 28 07/03/2021 0240   GLUCOSE 94 07/03/2021 0240   BUN 15 07/03/2021 0240   CREATININE 1.09 07/03/2021 0240   CALCIUM 9.3 07/03/2021 0240   PROT 6.8 07/03/2021 0240   ALBUMIN 4.2 07/03/2021 0240   AST 18 07/03/2021 0240   ALT 17 07/03/2021 0240   ALKPHOS 56 07/03/2021 0240   BILITOT 0.5 07/03/2021 0240   GFRNONAA >60 07/03/2021 0240   Lipid Panel  No results found for: CHOL, TRIG, HDL, CHOLHDL, VLDL, LDLCALC, LDLDIRECT No results found for: HGBA1C  Imaging I have reviewed the images obtained:  CT angio head and neck 07/03/2021: 1. Positive for a Right Thalamic lacunar infarct. No associated hemorrhage or mass effect. Although age indeterminate by CT, this is most likely the  symptomatic abnormality as CTA further demonstrates a partially occluded Right PCA P1 segment (non-dominant due to fetal PCA origins) correlating with Right Thalamostriate Artery ischemia. 2. But otherwise normal arterial findings in the head and neck. No atherosclerosis or atherosclerotic stenosis identified. 3. And otherwise negative CT appearance of the brain.  MRI examination of the brain, MRA head WO contrast 07/03/2021: 1. Acute right thalamic infarct. No evidence of hemorrhagic transformation. 2. No intracranial large vessel occlusion or hemodynamically significant stenosis. 3. 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  Echocardiogram Complete 07/03/2021: 1. Left ventricular ejection fraction, by estimation, is 60 to 65%. The left ventricle has normal function. The left ventricle has no regional  wall motion abnormalities. There is mild left ventricular hypertrophy. Left ventricular diastolic parameters were normal.   2. Right ventricular  systolic function is normal. The right ventricular size is normal. Tricuspid regurgitation signal is inadequate for assessing PA pressure.   3. The mitral valve is normal in structure. Trivial mitral valve regurgitation. No evidence of mitral stenosis.   4. The aortic valve is tricuspid. Aortic valve regurgitation is not visualized. No aortic stenosis is present.   5. The inferior vena cava is normal in size with greater than 50% respiratory variability, suggesting right atrial pressure of 3 mmHg.   Assessment: 60 y.o. male who presented to the ED for evaluation of left facial droop, dysarthria, leftward leaning gait, imbalance, incoordination, and somnolence starting on Tuesday, August 23 with residual mild ataxia.  - Neurologic assessment reveals patient with mild residual left upper extremity dysmetria but without further neurologic deficit; NIHSS of 1.  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    Recommendations: - HgbA1c with goal < 7% - Fasting lipid panel with goal LDL < 70; initiate statin therapy as needed - Frequent neuro checks - Prophylactic therapy- Antiplatelet med: DAPT Aspirin 325 mg once followed by 81 mg PO daily; clopidogrel 300 mg load followed by 75 mg daily for 21 days then ASA monotherapy - Risk factor modification - Telemetry monitoring - PT consult, OT consult, Speech consult - TEE as inpatient or outpatient - Zio patch as outpatient - Follow up with [facility] Leonie Man of [facility] after discharge - May benefit from a hypercoagulable work up. Will defer to stroke team.  - Stroke team to follow  Pt seen by NP/Neuro   Anibal Henderson, AGAC-NP [facility] Pager: [phone]  I have seen and examined the patient. I have reviewed the  assessment and recommendations and made amendations as indicated. 60 year old male with subacute right thalamic ischemic infarction. Etiology most likely atherothrombotic as CTA demonstrates a partially occluded Right PCA P1 segment (non-dominant due to fetal PCA origins) correlating with Right Thalamostriate Artery ischemia. Recommendations as above.  Electronically signed: Dr. Kerney Elbe

## 2021-07-03 NOTE — ED Notes (Signed)
Report called to [facility] with [place].

## 2021-07-03 NOTE — Progress Notes (Signed)
  Echocardiogram 2D Echocardiogram has been performed.  Martin Robinson F 07/03/2021, 1:34 PM

## 2021-07-03 NOTE — ED Notes (Signed)
Patient is laying in bed resting. Wife is at bedside. Patient/wife deny any needs at this time. No signs of distress noted. Bed is in lowest locked position and call bell is in reach. Will continue to monitor.

## 2021-07-03 NOTE — Evaluation (Signed)
Physical Therapy Evaluation & Discharge Patient Details Name: Martin Robinson MRN: [id number] DOB: August 07, 1961 Today's Date: 07/03/2021   History of Present Illness  60 y/o male presented to ED on 8/26 with concern for stroke. MRI revealed acute R thalamic infarct. PMH is unremarkable  Clinical Impression  PTA, patient lives with wife and is very active and independent. Patient currently functioning at independent level for mobility and stair negotiation. Patient demos L inattention with wayfinding back to room requiring cues to find room on L. Educated patient on scanning environment and slowing down pace for safety due to L inattention, patient and wife verbalized understanding. No further skilled PT needs required acutely. No PT follow up recommended at this time.     Follow Up Recommendations No PT follow up    Equipment Recommendations  None recommended by PT    Recommendations for Other Services       Precautions / Restrictions Precautions Precautions: None Precaution Comments: Impulsive Restrictions Weight Bearing Restrictions: No      Mobility  Bed Mobility Overal bed mobility: Independent                  Transfers Overall transfer level: Independent                  Ambulation/Gait Ambulation/Gait assistance: Independent Gait Distance (Feet): 200 Feet Assistive device: None Gait Pattern/deviations: WFL(Within Functional Limits)   Gait velocity interpretation: >4.37 ft/sec, indicative of normal walking speed General Gait Details: no difficulty or LOB with negotiating around obstacles or stepping over obstacles. demos L inattention with wayfinding back to room  Stairs Stairs: Yes Stairs assistance: Independent Stair Management: No rails;Alternating pattern;Forwards Number of Stairs: 20    Wheelchair Mobility    Modified Rankin (Stroke Patients Only) Modified Rankin (Stroke Patients Only) Pre-Morbid Rankin Score: No symptoms Modified  Rankin: No symptoms     Balance Overall balance assessment: No apparent balance deficits (not formally assessed)                                           Pertinent Vitals/Pain Pain Assessment: No/denies pain    Home Living Family/patient expects to be discharged to:: Private residence Living Arrangements: Spouse/significant other Available Help at Discharge: Family;Available 24 hours/day Type of Home: House Home Access: Stairs to enter   [employer] of Steps: 3-4 Home Layout: Multi-level;Laundry or work area in [employer]: None      Prior Function Level of Independence: Independent         Comments: Very active, plays golf almost daily.  Goes to the gym daily.  No assist with any aspect of mobility, ADL/IADL.     Hand Dominance   Dominant Hand: Right    Extremity/Trunk Assessment   Upper Extremity Assessment Upper Extremity Assessment: Overall WFL for tasks assessed    Lower Extremity Assessment Lower Extremity Assessment: Overall WFL for tasks assessed    Cervical / Trunk Assessment Cervical / Trunk Assessment: Normal  Communication   Communication: No difficulties  Cognition Arousal/Alertness: Awake/alert Behavior During Therapy: Impulsive Overall Cognitive Status: Within Functional Limits for tasks assessed                                 General Comments: impulsive trying to prove/show no deficits, however revealed L inattention when wayfind back  to room      General Comments      Exercises     Assessment/Plan    PT Assessment Patent does not need any further PT services  PT Problem List         PT Treatment Interventions      PT Goals (Current goals can be found in the Care Plan section)  Acute Rehab PT Goals Patient Stated Goal: Wants to go home today PT Goal Formulation: All assessment and education complete, DC therapy    Frequency     Barriers to discharge         Co-evaluation               AM-PAC PT "6 Clicks" Mobility  Outcome Measure Help needed turning from your back to your side while in a flat bed without using bedrails?: None Help needed moving from lying on your back to sitting on the side of a flat bed without using bedrails?: None Help needed moving to and from a bed to a chair (including a wheelchair)?: None Help needed standing up from a chair using your arms (e.g., wheelchair or bedside chair)?: None Help needed to walk in hospital room?: None Help needed climbing 3-5 steps with a railing? : None 6 Click Score: 24    End of Session   Activity Tolerance: Patient tolerated treatment well Patient left: in bed;with call bell/phone within reach;with family/visitor present Nurse Communication: Mobility status PT Visit Diagnosis: Muscle weakness (generalized) (M62.81)    Time: [id number] PT Time Calculation (min) (ACUTE ONLY): 12 min   Charges:   PT Evaluation $PT Eval Low Complexity: 1 Low          Dwon Sky A. Gilford Rile PT, DPT Acute Rehabilitation Services Pager [phone] Office [phone]   Linna Hoff 07/03/2021, 4:58 PM

## 2021-07-03 NOTE — ED Provider Notes (Addendum)
[facility] EMERGENCY DEPT Provider Note  CSN: [id number] Arrival date & time: 07/03/21 0224  Chief Complaint(s) Gait Problem  HPI Martin Robinson is a 60 y.o. male with past medical history listed below who presents to the emergency department with concern for stroke. Patient is accompanied by his wife who is a Marine scientist. The emergently return to the [place] from a trip to [place] due to the symptoms.  Patient was at his normal state of health until the morning of August 23rd. On this day the patient was more lethargic, had difficulty with his balance and coordination.  Wife also noted patient's speech was off and that he seemed to have a slight facial droop on the left.   Patient was taken to a local hospital where he was treated for dehydration without improvement. Later that night on August 23 he was taking to a larger hospital where he had a CT scan and blood work obtained.  His work-up was negative at that time.  They were unable to obtain an MRI due to the local health system requirements and limitations. His speech and facial droop seem to have improved. His balance difficulty and coordination persist. Given that his symptoms persisted they returned to the [place] to seek further medical care. They arrived back in the states 2 hours prior to arrival to this emergency department.  They report that the patient had only been in [place] for approximately 3 to 4 days prior to symptom onset.  No known suspicious food intake, infections, or exposures.  Patient was bitten by fire ants.  No other known insect bites.  No alcohol or drug use.  Patient is otherwise healthy.    The history is provided by the patient.   Past Medical History Past Medical History:  Diagnosis Date   Melanoma [facility])    Patient Active Problem List   Diagnosis Date Noted   Melanoma of skin ([place]) 09/10/2016   Home Medication(s) Prior to Admission medications   Not on File                                                                                                                                     Past Surgical History ** The histories are not reviewed yet. Please review them in the "History" navigator section and refresh this [place]. Family History History reviewed. No pertinent family history.  Social History Social History   Tobacco Use   Smoking status: Some Days    Types: Cigarettes   Tobacco comments:    Cigars, nicotine packets  Substance Use Topics   Alcohol use: Yes   Drug use: Never   Allergies Patient has no known allergies.  Review of Systems Review of Systems All other systems are reviewed and are negative for acute change except as noted in the HPI  Physical Exam Vital Signs  I have reviewed the triage vital signs BP (!) 137/95 (BP  Location: Right Arm)   Pulse 65   Temp 98 F (36.7 C) (Oral)   Resp 18   Ht '6\' 7"'$  (2.007 m)   Wt 83.9 kg   SpO2 100%   BMI 20.84 kg/m   Physical Exam Vitals reviewed.  Constitutional:      General: He is not in acute distress.    Appearance: He is well-developed. He is not diaphoretic.  HENT:     Head: Normocephalic and atraumatic.     Nose: Nose normal.  Eyes:     General: No scleral icterus.       Right eye: No discharge.        Left eye: No discharge.     Conjunctiva/sclera: Conjunctivae normal.     Pupils: Pupils are equal, round, and reactive to light.  Cardiovascular:     Rate and Rhythm: Normal rate and regular rhythm.     Heart sounds: No murmur heard.   No friction rub. No gallop.  Pulmonary:     Effort: Pulmonary effort is normal. No respiratory distress.     Breath sounds: Normal breath sounds. No stridor. No rales.  Abdominal:     General: There is no distension.     Palpations: Abdomen is soft.     Tenderness: There is no abdominal tenderness.  Musculoskeletal:        General: No tenderness.     Cervical back: Normal range of motion and neck supple.  Skin:     General: Skin is warm and dry.     Findings: No erythema or rash.  Neurological:     Mental Status: He is alert and oriented to person, place, and time.     Comments: Mental Status:  Alert and oriented to person, place, and time.  Attention and concentration normal.  Speech clear.  Recent memory is intact  Cranial Nerves:  II Visual Fields: Intact to confrontation. Visual fields intact. III, IV, VI: Pupils equal and reactive to light and near. Full eye movement without nystagmus  V Facial Sensation: Normal. No weakness of masticatory muscles  VII: No facial weakness or asymmetry  VIII Auditory Acuity: Grossly normal  IX/X: The uvula is midline; the palate elevates symmetrically  XI: Normal sternocleidomastoid and trapezius strength  XII: The tongue is midline. No atrophy or fasciculations.   Motor System: Muscle Strength: 5/5 and symmetric in the upper and lower extremities. No pronation or drift.  Muscle Tone: Tone and muscle bulk are normal in the upper and lower extremities.  Reflexes:  No Clonus Coordination: slight dysmetria with FtN on left. Sensation: Intact to light touch. Gait: ataxic routine and tandem gait, leaning to left  Test of skew abnormal on right eye.     ED Results and Treatments Labs (all labs ordered are listed, but only abnormal results are displayed) Labs Reviewed  URINALYSIS, ROUTINE W REFLEX MICROSCOPIC - Abnormal; Notable for the following components:      Result Value   Color, Urine COLORLESS (*)    Specific Gravity, Urine <1.005 (*)    All other components within normal limits  RESP PANEL BY RT-PCR (FLU A&B, COVID) [id number]  COMPREHENSIVE METABOLIC PANEL  CBC  ETHANOL  PROTIME-INR  APTT  RAPID URINE DRUG SCREEN, HOSP PERFORMED  EKG  EKG Interpretation  Date/Time:  Friday July 03 2021 03:43:47 EDT Ventricular Rate:   74 PR Interval:  152 QRS Duration: 87 QT Interval:  414 QTC Calculation: 460 R Axis:   61 Text Interpretation: Sinus rhythm Abnormal R-wave progression, early transition No old tracing to compare Confirmed by Addison Lank [phone]) on 07/03/2021 4:16:16 AM       Radiology CT Angio Head W or Wo Contrast  Addendum Date: 07/03/2021   ADDENDUM REPORT: 07/03/2021 04:53 ADDENDUM: Study discussed by telephone with Dr. Addison Lank on 07/03/2021 at 0446 hours. Electronically Signed   By: Genevie Ann M.D.   On: 07/03/2021 04:53   Result Date: 07/03/2021 CLINICAL DATA:  60 year old male with altered mental status three days after a fall. Slurred speech. EXAM: CT ANGIOGRAPHY HEAD AND NECK TECHNIQUE: Multidetector CT imaging of the head and neck was performed using the standard protocol during bolus administration of intravenous contrast. Multiplanar CT image reconstructions and MIPs were obtained to evaluate the vascular anatomy. Carotid stenosis measurements (when applicable) are obtained utilizing NASCET criteria, using the distal internal carotid diameter as the denominator. CONTRAST:  146m OMNIPAQUE IOHEXOL 350 MG/ML SOLN COMPARISON:  None. FINDINGS: CT HEAD Brain: Conspicuous oval hypodensity in the anterior right thalamus measures about 10 mm diameter and 2 cm in length (series 3, image 13 and series 9, image 25). No associated hemorrhage. No regional mass effect. Elsewhere gray-white matter differentiation is within normal limits. No midline shift, ventriculomegaly, mass effect, evidence of mass lesion, intracranial hemorrhage or evidence of cortically based acute infarction. Calvarium and skull base: Intact, negative. Paranasal sinuses: Visualized paranasal sinuses and mastoids are clear. Orbits: Visualized orbits and scalp soft tissues are within normal limits. CTA NECK Skeleton: No acute osseous abnormality identified. Minimal degenerative changes in the visible spine. Upper chest: Negative. Other neck:  Negative. Aortic arch: 3 vessel arch configuration.  No arch atherosclerosis. Right carotid system: Negative right CCA and right carotid bifurcation. Tortuous right ICA distal to the bulb, but no plaque or stenosis to the skull base. Left carotid system: Negative. Vertebral arteries: Normal proximal right subclavian artery and right vertebral artery origin. Right vertebral artery is diminutive and appears non dominant throughout the neck but remains patent to the skull base with no plaque or stenosis identified. Proximal left subclavian artery and left vertebral artery origin are normal. The left vertebral artery appears dominant, but with a relatively normal caliber. The left vertebral is patent to the skull base without plaque or stenosis. CTA HEAD Posterior circulation: Patent distal vertebral arteries to the vertebrobasilar junction without plaque or stenosis. Dominant left V4. Mildly fenestrated vertebrobasilar junction (normal variant). Patent right PICA origin. Left AICA may be dominant and appears patent. Patent although diminutive basilar artery which functionally terminates at the SCA is. There are fetal type bilateral PCA origins. But furthermore, there is evidence of a small right PCA P1 segment arising from the basilar tip but abruptly terminating as seen on series 15, image 21. Bilateral PCA branches otherwise appear within normal limits. Anterior circulation: Both ICA siphons are patent with no plaque or stenosis. Bilateral ophthalmic and posterior communicating artery origins are normal. Patent carotid termini. Normal MCA and ACA origins. Normal anterior communicating artery. Bilateral ACA branches are within normal limits. Left MCA M1 segment, bifurcation and branches are within normal limits. Right MCA M1 segment, bifurcation and branches are within normal limits. Venous sinuses: Patent. Anatomic variants: Diminutive vertebrobasilar system on the basis of fetal type bilateral PCA origins. Dominant  left and diminutive right vertebral arteries. Review of the MIP images confirms the above findings IMPRESSION: 1. Positive for a Right Thalamic lacunar infarct. No associated hemorrhage or mass effect. Although age indeterminate by CT, this is most likely the symptomatic abnormality as CTA further demonstrates a partially occluded Right PCA P1 segment (non-dominant due to fetal PCA origins) correlating with Right Thalamostriate Artery ischemia. 2. But otherwise normal arterial findings in the head and neck. No atherosclerosis or atherosclerotic stenosis identified. 3. And otherwise negative CT appearance of the brain. Electronically Signed: By: Genevie Ann M.D. On: 07/03/2021 04:41   CT Angio Neck W and/or Wo Contrast  Addendum Date: 07/03/2021   ADDENDUM REPORT: 07/03/2021 04:53 ADDENDUM: Study discussed by telephone with Dr. Addison Lank on 07/03/2021 at 0446 hours. Electronically Signed   By: Genevie Ann M.D.   On: 07/03/2021 04:53   Result Date: 07/03/2021 CLINICAL DATA:  60 year old male with altered mental status three days after a fall. Slurred speech. EXAM: CT ANGIOGRAPHY HEAD AND NECK TECHNIQUE: Multidetector CT imaging of the head and neck was performed using the standard protocol during bolus administration of intravenous contrast. Multiplanar CT image reconstructions and MIPs were obtained to evaluate the vascular anatomy. Carotid stenosis measurements (when applicable) are obtained utilizing NASCET criteria, using the distal internal carotid diameter as the denominator. CONTRAST:  125m OMNIPAQUE IOHEXOL 350 MG/ML SOLN COMPARISON:  None. FINDINGS: CT HEAD Brain: Conspicuous oval hypodensity in the anterior right thalamus measures about 10 mm diameter and 2 cm in length (series 3, image 13 and series 9, image 25). No associated hemorrhage. No regional mass effect. Elsewhere gray-white matter differentiation is within normal limits. No midline shift, ventriculomegaly, mass effect, evidence of mass lesion,  intracranial hemorrhage or evidence of cortically based acute infarction. Calvarium and skull base: Intact, negative. Paranasal sinuses: Visualized paranasal sinuses and mastoids are clear. Orbits: Visualized orbits and scalp soft tissues are within normal limits. CTA NECK Skeleton: No acute osseous abnormality identified. Minimal degenerative changes in the visible spine. Upper chest: Negative. Other neck: Negative. Aortic arch: 3 vessel arch configuration.  No arch atherosclerosis. Right carotid system: Negative right CCA and right carotid bifurcation. Tortuous right ICA distal to the bulb, but no plaque or stenosis to the skull base. Left carotid system: Negative. Vertebral arteries: Normal proximal right subclavian artery and right vertebral artery origin. Right vertebral artery is diminutive and appears non dominant throughout the neck but remains patent to the skull base with no plaque or stenosis identified. Proximal left subclavian artery and left vertebral artery origin are normal. The left vertebral artery appears dominant, but with a relatively normal caliber. The left vertebral is patent to the skull base without plaque or stenosis. CTA HEAD Posterior circulation: Patent distal vertebral arteries to the vertebrobasilar junction without plaque or stenosis. Dominant left V4. Mildly fenestrated vertebrobasilar junction (normal variant). Patent right PICA origin. Left AICA may be dominant and appears patent. Patent although diminutive basilar artery which functionally terminates at the SCA is. There are fetal type bilateral PCA origins. But furthermore, there is evidence of a small right PCA P1 segment arising from the basilar tip but abruptly terminating as seen on series 15, image 21. Bilateral PCA branches otherwise appear within normal limits. Anterior circulation: Both ICA siphons are patent with no plaque or stenosis. Bilateral ophthalmic and posterior communicating artery origins are normal. Patent  carotid termini. Normal MCA and ACA origins. Normal anterior communicating artery. Bilateral ACA branches are within normal limits. Left MCA M1  segment, bifurcation and branches are within normal limits. Right MCA M1 segment, bifurcation and branches are within normal limits. Venous sinuses: Patent. Anatomic variants: Diminutive vertebrobasilar system on the basis of fetal type bilateral PCA origins. Dominant left and diminutive right vertebral arteries. Review of the MIP images confirms the above findings IMPRESSION: 1. Positive for a Right Thalamic lacunar infarct. No associated hemorrhage or mass effect. Although age indeterminate by CT, this is most likely the symptomatic abnormality as CTA further demonstrates a partially occluded Right PCA P1 segment (non-dominant due to fetal PCA origins) correlating with Right Thalamostriate Artery ischemia. 2. But otherwise normal arterial findings in the head and neck. No atherosclerosis or atherosclerotic stenosis identified. 3. And otherwise negative CT appearance of the brain. Electronically Signed: By: Genevie Ann M.D. On: 07/03/2021 04:41    Pertinent labs & imaging results that were available during my care of the patient were reviewed by me and considered in my medical decision making (see MDM for details).  Medications Ordered in ED Medications  alum & mag hydroxide-simeth (MAALOX/MYLANTA) 200-200-20 MG/5ML suspension 15 mL (has no administration in time range)  sodium chloride 0.9 % bolus 1,000 mL (1,000 mLs Intravenous New Bag/Given 07/03/21 0324)  iohexol (OMNIPAQUE) 350 MG/ML injection 100 mL (100 mLs Intravenous Contrast Given 07/03/21 0348)                                                                                                                                     Procedures .1-3 Lead EKG Interpretation  Date/Time: 07/03/2021 4:57 AM Performed by: Fatima Blank, MD Authorized by: Fatima Blank, MD     Interpretation: normal      ECG rate:  72   ECG rate assessment: normal     Rhythm: sinus rhythm     Ectopy: none     Conduction: normal    (including critical care time)  Medical Decision Making / ED Course I have reviewed the nursing notes for this encounter and the patient's prior records (if available in EHR or on provided paperwork).  [name] was evaluated in Emergency Department on 07/03/2021 for the symptoms described in the history of present illness. He was evaluated in the context of the global COVID-19 pandemic, which necessitated consideration that the patient might be at risk for infection with the SARS-CoV-2 virus that causes COVID-19. Institutional protocols and algorithms that pertain to the evaluation of patients at risk for COVID-19 are in a state of rapid change based on information released by regulatory bodies including the CDC and federal and state organizations. These policies and algorithms were followed during the patient's care in the ED.     Patient has evidence concerning for subacute stroke Will get screening labs and CT angio.  Pertinent labs & imaging results that were available during my care of the patient were reviewed by me and considered in my medical decision making:  Labs  w/o leukocytosis or anemia. No electrolyte derangements or renal insufficiency. CTA notable for likely subacute right thalamic stroke. Will admit for stroke work up and management. Consult to Neurology and Hospitalist services.   Final Clinical Impression(s) / ED Diagnoses Final diagnoses:  Right thalamic stroke [facility])     This chart was dictated using voice recognition software.  Despite best efforts to proofread,  errors can occur which can change the documentation meaning.      Fatima Blank, MD 07/03/21 [phone]

## 2021-07-03 NOTE — ED Notes (Signed)
Report attempted 

## 2021-07-03 NOTE — ED Notes (Signed)
[facility] at bedside 

## 2021-07-03 NOTE — ED Triage Notes (Signed)
Patient arrives from home with c/o altered mental status since Tuesday. Patient wife reports a fall on Tuesday with facial droop, slurred speech and slept for 16 hours before the fall. [place].   Patient reports feeling like he is intoxicated.

## 2021-07-03 NOTE — Evaluation (Signed)
Occupational Therapy Evaluation Patient Details Name: Martin Robinson MRN: [id number] DOB: [birth year] Today's Date: 07/03/2021    History of Present Illness 60 y.o. male with past medical history listed below who presents to the emergency department with concern for stroke.  PMH is unremarkable.  [id number] right thalamic infarct.   Clinical Impression   Patient admitted for the above diagnosis.  Currently he is presenting at his baseline, with no residual deficits noted to mobility or ADL/IADL completion.  Patient is hoping to go home today, or first thing tomorrow morning.  Spouse is an Therapist, sports, and works 1x/wk.  Can be at home as needed.  No further OT needs in the acute or post acute setting.  BE-FAST stroke symptoms reviewed with verbal understanding from patient.      Follow Up Recommendations  No OT follow up;Supervision - Intermittent    Equipment Recommendations  None recommended by OT    Recommendations for Other Services       Precautions / Restrictions Precautions Precautions: Fall Precaution Comments: Impulsive Restrictions Weight Bearing Restrictions: No      Mobility Bed Mobility Overal bed mobility: Independent               Patient Response: Cooperative  Transfers Overall transfer level: Independent                    Balance Overall balance assessment: No apparent balance deficits (not formally assessed)                                         ADL either performed or assessed with clinical judgement   ADL Overall ADL's : At baseline                                             Vision Baseline Vision/History: 1 Wears glasses Ability to See in Adequate Light: 0 Adequate Patient Visual Report: No change from baseline       Perception  [facility]   Praxis  Intact    Pertinent Vitals/Pain Pain Assessment: No/denies pain     Hand Dominance Right   Extremity/Trunk Assessment Upper Extremity  Assessment Upper Extremity Assessment: Overall WFL for tasks assessed       Cervical / Trunk Assessment Cervical / Trunk Assessment: Normal   Communication Communication Communication: No difficulties   Cognition Arousal/Alertness: Awake/alert Behavior During Therapy: WFL for tasks assessed/performed;Impulsive Overall Cognitive Status: Within Functional Limits for tasks assessed                                 General Comments: Impulsive - trying to prove/show he has no deficits.   General Comments   VSS on RA    Exercises     Shoulder Instructions      Home Living Family/patient expects to be discharged to:: Private residence Living Arrangements: Spouse/significant other Available Help at Discharge: Family;Available 24 hours/day Type of Home: House Home Access: Stairs to enter [employer] of Steps: 3-4   Home Layout: Multi-level;Laundry or work area in basement Alternate Therapist, sports of Steps: full flight   Bathroom Shower/Tub: Tub/shower unit;Walk-in shower   Bathroom Toilet: Standard     Home Equipment: None  Prior Functioning/Environment Level of Independence: Independent        Comments: Very active, plays golf almost daily.  Goes to the gym daily.  No assist with any aspect of mobility, ADL/IADL.        OT Problem List: Impaired balance (sitting and/or standing)      OT Treatment/Interventions:      OT Goals(Current goals can be found in the care plan section) Acute Rehab OT Goals Patient Stated Goal: Wants to go home today OT Goal Formulation: With patient Time For Goal Achievement: 07/03/21 Potential to Achieve Goals: Good  OT Frequency:     Barriers to D/C:  None noted          Co-evaluation              AM-PAC OT "6 Clicks" Daily Activity     Outcome Measure Help from another person eating meals?: None Help from another person taking care of personal grooming?: None Help from another  person toileting, which includes using toliet, bedpan, or urinal?: None Help from another person bathing (including washing, rinsing, drying)?: None Help from another person to put on and taking off regular upper body clothing?: None Help from another person to put on and taking off regular lower body clothing?: None 6 Click Score: 24   End of Session Nurse Communication: Mobility status  Activity Tolerance: Patient tolerated treatment well Patient left: in bed;with call bell/phone within reach  OT Visit Diagnosis: Unsteadiness on feet (R26.81)                Time: [id number] OT Time Calculation (min): 18 min Charges:  OT General Charges $OT Visit: 1 Visit OT Evaluation $OT Eval Moderate Complexity: 1 Mod  07/03/2021  RP, OTR/L  Acute Rehabilitation Services  Office:  [phone]   Martin Robinson 07/03/2021, 3:38 PM

## 2021-07-04 ENCOUNTER — Telehealth: Payer: Self-pay | Admitting: Neurology

## 2021-07-04 ENCOUNTER — Observation Stay (HOSPITAL_BASED_OUTPATIENT_CLINIC_OR_DEPARTMENT_OTHER): Payer: Commercial Managed Care - PPO

## 2021-07-04 DIAGNOSIS — I639 Cerebral infarction, unspecified: Secondary | ICD-10-CM

## 2021-07-04 DIAGNOSIS — D6859 Other primary thrombophilia: Secondary | ICD-10-CM

## 2021-07-04 LAB — LIPID PANEL
Cholesterol: 179 mg/dL (ref 0–200)
HDL: 48 mg/dL (ref >40)
LDL Cholesterol: 115 mg/dL — ABNORMAL HIGH (ref 0–99)
Total CHOL/HDL Ratio: 3.7 RATIO
Triglycerides: 80 mg/dL (ref <150)
VLDL: 16 mg/dL (ref 0–40)

## 2021-07-04 LAB — HEMOGLOBIN A1C
Hgb A1c MFr Bld: 5.7 % — ABNORMAL HIGH (ref 4.8–5.6)
Mean Plasma Glucose: 116.89 mg/dL

## 2021-07-04 LAB — HIV ANTIBODY (ROUTINE TESTING W REFLEX): HIV Screen 4th Generation wRfx: NONREACTIVE

## 2021-07-04 MED ORDER — CLOPIDOGREL BISULFATE 75 MG PO TABS
300.0000 mg | ORAL_TABLET | Freq: Once | ORAL | Status: AC
  Administered 2021-07-04: 300 mg via ORAL
  Filled 2021-07-04: qty 4

## 2021-07-04 MED ORDER — CLOPIDOGREL BISULFATE 75 MG PO TABS: 75.0000 mg | ORAL_TABLET | Freq: Every day | ORAL | 0 refills | Status: AC

## 2021-07-04 MED ORDER — STROKE: EARLY STAGES OF RECOVERY BOOK
Status: AC
  Filled 2021-07-04: qty 1

## 2021-07-04 MED ORDER — ATORVASTATIN CALCIUM 40 MG PO TABS: 40.0000 mg | ORAL_TABLET | Freq: Every day | ORAL | 2 refills | Status: AC

## 2021-07-04 MED ORDER — ASPIRIN 81 MG PO TBEC: 81.0000 mg | DELAYED_RELEASE_TABLET | Freq: Every day | ORAL | 11 refills | Status: AC

## 2021-07-04 MED ORDER — CLOPIDOGREL BISULFATE 75 MG PO TABS: 75.0000 mg | ORAL_TABLET | Freq: Every day | ORAL | Status: DC

## 2021-07-04 NOTE — Progress Notes (Signed)
Discharge:  Pt d/c from room via walking, Family member with the pt.  Discharge instructions given to the patient and family members.  No questions from pt,  reintegrated to the pt to call or go to the ED for chest discomfort.  Pt dressed in street clothes and left with discharge papers and prescriptions  in hand.  IV d/ced, tele removed and no complaints of pain or discomfort.

## 2021-07-04 NOTE — Progress Notes (Signed)
STROKE TEAM PROGRESS NOTE   INTERVAL HISTORY His wife is at bedside.  Wife is at bedside, had extended conversation, discussed stroke in a location that is more consistent with small vessel or lacunar infarction however given the size and young age and health of the patient we do need an embolic work-up especially since he had been recently traveling.  Dr. Junius Roads is his primary care who would like him to see myself or Dr. Crist Fat.  Patient would like to go home.  We discussed the need for further work-up.  Unfortunately we cannot get the hypercoagulable labs inpatient, I have ordered them outpatient and instructed patient to come to our office and get them done.  I will work with Dr. Leonie Man to get him an appointment with [place] in 4 to 6 weeks, he will also likely need TEE and loop and or TCD I will discuss with Dr. Leonie Man.  Stroke team will sign off after patient's ultrasounds for DVT are complete.  Vitals:   07/03/21 2031 07/03/21 2355 07/04/21 0400 07/04/21 0845  BP: 114/80 115/74 113/80 119/82  Pulse: 84 80 78 74  Resp: '18 18 18 18  '$ Temp: 98.1 F (36.7 C) 97.9 F (36.6 C) 98.1 F (36.7 C) 98.3 F (36.8 C)  TempSrc: Oral Oral Oral Oral  SpO2: 98% 99% 99% 98%  Weight:      Height:       CBC:  Recent Labs  Lab 07/03/21 0240  WBC 6.1  HGB 13.6  HCT 40.7  MCV 91.1  PLT [id number]   Basic Metabolic Panel:  Recent Labs  Lab 07/03/21 0240  NA 136  K 3.5  CL 101  CO2 28  GLUCOSE 94  BUN 15  CREATININE 1.09  CALCIUM 9.3   Lipid Panel:  Recent Labs  Lab 07/04/21 0145  CHOL 179  TRIG 80  HDL 48  CHOLHDL 3.7  VLDL 16  LDLCALC 115*   HgbA1c:  Recent Labs  Lab 07/04/21 0145  HGBA1C 5.7*   Urine Drug Screen:  Recent Labs  Lab 07/03/21 0340  LABOPIA NONE DETECTED  COCAINSCRNUR NONE DETECTED  LABBENZ NONE DETECTED  AMPHETMU NONE DETECTED  THCU NONE DETECTED  LABBARB NONE DETECTED    Alcohol Level  Recent Labs  Lab 07/03/21 0340  ETH <10    IMAGING past 24 hours MR  ANGIO HEAD WO CONTRAST  Result Date: 07/03/2021 CLINICAL DATA:  Stroke follow-up. EXAM: MRI HEAD WITHOUT CONTRAST MRA HEAD WITHOUT CONTRAST TECHNIQUE: Multiplanar, multi-echo pulse sequences of the brain and surrounding structures were acquired without intravenous contrast. Angiographic images of the Circle of Willis were acquired using MRA technique without intravenous contrast. COMPARISON:  Head CT July 03, 2021. FINDINGS: MRI HEAD FINDINGS Brain: Area of restricted diffusion involving the anterior aspect of the right thalamus, corresponding to hypodensity seen on prior CT. No other focus of restricted diffusion seen. No hemorrhage, hydrocephalus, extra-axial collection mass. Vascular: Normal flow voids. Skull and upper cervical spine: Normal marrow signal. Sinuses/Orbits: No acute or significant finding. Other: None. MRA HEAD FINDINGS Anterior circulation: The visualized portions of the distal cervical and intracranial internal carotid arteries are widely patent with normal flow related enhancement. The bilateral anterior cerebral arteries and middle cerebral arteries are widely patent with antegrade flow without high-grade flow-limiting stenosis or proximal branch occlusion. Prominent bilateral posterior communicating arteries. No intracranial aneurysm within the anterior circulation. Posterior circulation: The vertebral arteries are widely patent with antegrade flow. The posterior inferior cerebral arteries are normal. Vertebrobasilar junction  and basilar artery are widely patent with antegrade flow without evidence of basilar stenosis or aneurysm. Hypoplastic right P1/PCA and severely hypoplastic versus aplastic left P1/PCA with the bilateral posterior cerebral arteries supplied predominantly by prominent posterior communicating arteries (fetal PCAs). No intracranial aneurysm within the posterior circulation. Anatomic variants: Bilateral fetal PCAs. IMPRESSION: 1. Acute right thalamic infarct. No evidence  of hemorrhagic transformation. 2. No intracranial large vessel occlusion or hemodynamically significant stenosis. 3. 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 Electronically Signed   By: Pedro Earls M.D.   On: 07/03/2021 14:52   MR BRAIN WO CONTRAST  Result Date: 07/03/2021 CLINICAL DATA:  Stroke follow-up. EXAM: MRI HEAD WITHOUT CONTRAST MRA HEAD WITHOUT CONTRAST TECHNIQUE: Multiplanar, multi-echo pulse sequences of the brain and surrounding structures were acquired without intravenous contrast. Angiographic images of the Circle of Willis were acquired using MRA technique without intravenous contrast. COMPARISON:  Head CT July 03, 2021. FINDINGS: MRI HEAD FINDINGS Brain: Area of restricted diffusion involving the anterior aspect of the right thalamus, corresponding to hypodensity seen on prior CT. No other focus of restricted diffusion seen. No hemorrhage, hydrocephalus, extra-axial collection mass. Vascular: Normal flow voids. Skull and upper cervical spine: Normal marrow signal. Sinuses/Orbits: No acute or significant finding. Other: None. MRA HEAD FINDINGS Anterior circulation: The visualized portions of the distal cervical and intracranial internal carotid arteries are widely patent with normal flow related enhancement. The bilateral anterior cerebral arteries and middle cerebral arteries are widely patent with antegrade flow without high-grade flow-limiting stenosis or proximal branch occlusion. Prominent bilateral posterior communicating arteries. No intracranial aneurysm within the anterior circulation. Posterior circulation: The vertebral arteries are widely patent with antegrade flow. The posterior inferior cerebral arteries are normal. Vertebrobasilar junction and basilar artery are widely patent with antegrade flow without evidence of basilar stenosis or aneurysm. Hypoplastic right P1/PCA and severely  hypoplastic versus aplastic left P1/PCA with the bilateral posterior cerebral arteries supplied predominantly by prominent posterior communicating arteries (fetal PCAs). No intracranial aneurysm within the posterior circulation. Anatomic variants: Bilateral fetal PCAs. IMPRESSION: 1. Acute right thalamic infarct. No evidence of hemorrhagic transformation. 2. No intracranial large vessel occlusion or hemodynamically significant stenosis. 3. 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 Electronically Signed   By: Pedro Earls M.D.   On: 07/03/2021 14:52   ECHOCARDIOGRAM COMPLETE  Result Date: 07/03/2021    ECHOCARDIOGRAM REPORT   Patient Name:   Martin Robinson Date of Exam: 07/03/2021 Medical Rec #:  [id number]         Height:       79.0 in Accession #:    [id number]        Weight:       185.0 lb Date of Birth:  Jan 15, 1961          BSA:          2.205 m Patient Age:    12 years          BP:           115/82 mmHg Patient Gender: M                 HR:           72 bpm. Exam Location:  Inpatient Procedure: 2D Echo, Cardiac Doppler and Color Doppler Indications:    Stroke  History:        Patient has no prior history of  Echocardiogram examinations.  Sonographer:    Merrie Roof RDCS Referring Phys: [place]  1. Left ventricular ejection fraction, by estimation, is 60 to 65%. The left ventricle has normal function. The left ventricle has no regional wall motion abnormalities. There is mild left ventricular hypertrophy. Left ventricular diastolic parameters were normal.  2. Right ventricular systolic function is normal. The right ventricular size is normal. Tricuspid regurgitation signal is inadequate for assessing PA pressure.  3. The mitral valve is normal in structure. Trivial mitral valve regurgitation. No evidence of mitral stenosis.  4. The aortic valve is tricuspid. Aortic valve regurgitation is not  visualized. No aortic stenosis is present.  5. The inferior vena cava is normal in size with greater than 50% respiratory variability, suggesting right atrial pressure of 3 mmHg. Conclusion(s)/Recommendation(s): No intracardiac source of embolism detected on this transthoracic study. A transesophageal echocardiogram is recommended to exclude cardiac source of embolism if clinically indicated. FINDINGS  Left Ventricle: Left ventricular ejection fraction, by estimation, is 60 to 65%. The left ventricle has normal function. The left ventricle has no regional wall motion abnormalities. The left ventricular internal cavity size was normal in size. There is  mild left ventricular hypertrophy. Left ventricular diastolic parameters were normal. Right Ventricle: The right ventricular size is normal. No increase in right ventricular wall thickness. Right ventricular systolic function is normal. Tricuspid regurgitation signal is inadequate for assessing PA pressure. Left Atrium: Left atrial size was normal in size. Right Atrium: Right atrial size was normal in size. Pericardium: There is no evidence of pericardial effusion. Mitral Valve: The mitral valve is normal in structure. Trivial mitral valve regurgitation. No evidence of mitral valve stenosis. Tricuspid Valve: The tricuspid valve is normal in structure. Tricuspid valve regurgitation is trivial. No evidence of tricuspid stenosis. Aortic Valve: The aortic valve is tricuspid. Aortic valve regurgitation is not visualized. No aortic stenosis is present. Aortic valve mean gradient measures 3.0 mmHg. Aortic valve peak gradient measures 5.4 mmHg. Aortic valve area, by VTI measures 3.60 cm. Pulmonic Valve: The pulmonic valve was normal in structure. Pulmonic valve regurgitation is trivial. No evidence of pulmonic stenosis. Aorta: The aortic root is normal in size and structure. Venous: The inferior vena cava is normal in size with greater than 50% respiratory variability,  suggesting right atrial pressure of 3 mmHg. IAS/Shunts: No atrial level shunt detected by color flow Doppler.  LEFT VENTRICLE PLAX 2D LVIDd:         5.00 cm  Diastology LVIDs:         3.30 cm  LV e' medial:    10.30 cm/s LV PW:         1.20 cm  LV E/e' medial:  6.2 LV IVS:        0.80 cm  LV e' lateral:   13.10 cm/s LVOT diam:     2.30 cm  LV E/e' lateral: 4.8 LV SV:         86 LV SV Index:   39 LVOT Area:     4.15 cm  RIGHT VENTRICLE RV Basal diam:  3.70 cm RV S prime:     11.90 cm/s TAPSE (M-mode): 2.0 cm LEFT ATRIUM             Index       RIGHT ATRIUM           Index LA diam:        3.90 cm 1.77 cm/m  RA Area:  16.30 cm LA Vol (A2C):   53.1 ml 24.09 ml/m RA Volume:   39.40 ml  17.87 ml/m LA Vol (A4C):   43.4 ml 19.69 ml/m LA Biplane Vol: 50.3 ml 22.82 ml/m  AORTIC VALVE AV Area (Vmax):    3.90 cm AV Area (Vmean):   3.70 cm AV Area (VTI):     3.60 cm AV Vmax:           116.00 cm/s AV Vmean:          78.300 cm/s AV VTI:            0.240 m AV Peak Grad:      5.4 mmHg AV Mean Grad:      3.0 mmHg LVOT Vmax:         109.00 cm/s LVOT Vmean:        69.700 cm/s LVOT VTI:          0.208 m LVOT/AV VTI ratio: 0.87  AORTA Ao Root diam: 3.30 cm Ao Asc diam:  3.00 cm MITRAL VALVE MV Area (PHT): 3.48 cm    SHUNTS MV Decel Time: 218 msec    Systemic VTI:  0.21 m MV E velocity: 63.40 cm/s  Systemic Diam: 2.30 cm MV A velocity: 58.20 cm/s MV E/A ratio:  1.09 Cherlynn Kaiser MD Electronically signed by Cherlynn Kaiser MD Signature Date/Time: 07/03/2021/2:54:53 PM    Final     PHYSICAL EXAM Physical Exam  Constitutional: Appears well-developed and well-nourished.  Psych: Affect appropriate to situation Eyes: Normal external eye and conjunctiva. HENT: Normocephalic, no lesions, without obvious abnormality.   Musculoskeletal-no joint tenderness, deformity or swelling Cardiovascular: Normal rate and regular rhythm.  Respiratory: Effort normal, non-labored breathing saturations WNL GI: Soft.  No distension. There  is no tenderness.  Skin: WDI   Neuro:  Mental Status: Alert, oriented, thought content appropriate.  Speech fluent without evidence of aphasia.  Able to follow commands without difficulty. Cranial Nerves: II:  Visual fields grossly normal,  III,IV, VI: ptosis not present, extra-ocular motions intact bilaterally pupils equal, round, reactive to ligh V,VII: smile symmetric, facial light touch sensation normal bilaterally VIII: hearing normal bilaterally IX,X: uvula rises symmetrically XI: bilateral shoulder shrug XII: midline tongue extension Motor: Right : Upper extremity   5/5  Left:     Upper extremity   5/5  Lower extremity   5/5  Lower extremity   5/5 Tone and bulk:normal tone throughout; no atrophy noted Sensory: light touch intact throughout, bilaterally Cerebellar: Normal FNF Gait: deferred     ASSESSMENT/PLAN Martin Robinson is a 60 y.o. male male with a medical history significant for melanoma who presented to the ED for evaluation of transient dysarthria, left facial droop, incoordination, and leftward leaning gait. He and his wife were in [place] visiting family when Mr. Eischeid woke up on Tuesday, August 23 after sleeping in with symptoms of lethargy, imbalance, incoordination, slurred speech, and left facial droop. He states that while he was walking he felt like a magnet was pulling him to the left and he went to step up on a 3 foot curb and fell into the street. His wife is an ER nurse and took him to the hospital with unremarkable labs and a head CT that was officially read as "negative" (review of films brought back from [place] reveals a right thalamic hypodensity at the same location as the lesion seen on CT subsequently obtained in the [place]) with most of his symptoms resolved, with only  mild incoordination persisting. They thought he may have had a TIA. When he woke up on Wednesday however, he was still ataxic and more lethargic than baseline, so  they decided to come home early for further evaluation in the [place]   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   CT head right thalamic lacunar infarct   CTA head & neck no LVO  MRI  acute right thalamic infarct MRA  no LVO 2D Echo pending LDL 115 HgbA1c 5.7 VTE prophylaxis - lovenox    Diet   Diet Heart Room service appropriate? Yes; Fluid consistency: Thin   No antithrombotic prior to admission, now on aspirin 81 mg daily and clopidogrel 75 mg daily. Continue ASA and plavix for 3 weeks and then ASA alone.  Therapy recommendations:  none Disposition:  home  Hypertension Home meds:  none Stable Permissive hypertension (OK if < 220/120) but gradually normalize in 5-7 days Long-term BP goal normotensive  Hyperlipidemia Home meds:  coq10, not resumed in hospital LDL 115, goal < 70 Add atorvastatin 40 mg daily Continue statin at discharge  Diabetes type II (no formal diagnosis) Home meds:  none HgbA1c 5.7, goal < 7.0  Other Stroke Risk Factors ETOH use, alcohol level <10, advised to drink no more than 2 drink(s) a day Obesity, Body mass index is 20.84 kg/m., BMI >/= 30 associated with increased stroke risk, recommend weight loss, diet and exercise as appropriate   Hospital day # 0  Laurey Morale, MSN, NP-C [facility] [phone]  Personally examined patient and images, and have participated in and made any corrections needed to history, physical, neuro exam,assessment and plan as stated above.  I have personally obtained the history, evaluated lab date, reviewed imaging studies and agree with radiology interpretations.    Sarina Ill, MD Stroke Neurology  I spent 25 minutes of face-to-face and non-face-to-face time with patient. This included prechart review, lab review, study review, order entry, electronic health record documentation, patient education on the different diagnostic  and therapeutic options, counseling and coordination of care, risks and benefits of management, compliance, or risk factor reduction   His wife is at bedside.  Wife is at bedside, had extended conversation, discussed stroke in a location that is more consistent with small vessel or lacunar infarction however given the size and young age and health of the patient we do need an embolic work-up especially since he had been recently traveling.  Dr. Junius Roads is his primary care who would like him to see myself or Dr. Crist Fat.  Patient would like to go home.  We discussed the need for further work-up.  Unfortunately we cannot get the hypercoagulable labs inpatient, I have ordered them outpatient and instructed patient to come to our office and get them done.  I will work with Dr. Leonie Man to get him an appointment with [place] in 4 to 6 weeks, he will also likely need TEE and loop and or TCD I will discuss with Dr. Leonie Man.  Stroke team will sign off after patient's ultrasounds for DVT are complete.  To contact Stroke Continuity provider, please refer to [email or web address] After hours, contact General Neurology

## 2021-07-04 NOTE — Progress Notes (Signed)
VASCULAR LAB    Bilateral lower extremity venous duplex has been performed.  See CV proc for preliminary results.   Chyanne Kohut, RVT 07/04/2021, 3:37 PM

## 2021-07-04 NOTE — Telephone Encounter (Signed)
Young patient with stroke. I saw him in the hospital this weekend. Dr. Junius Roads would like myself or Dr. Leonie Man to see him in follow up. Patient needs an appointment with myself or dr Leonie Man in the next 4-6 weeks. He will also need a TEE and loop outpatient(I will follow up with that) or per Dr. Leonie Man possibly a TCD instead (Dr. Leonie Man can you please answer). Also I have ordered a large panel of hypercoagulable labs if he can come to the office prior and have them completed (give him instructions) prior to appointment. If he is placed on my schedule give him extra time with me such as end of day or an emg spot(wife is a nurse here at [facility]  believe). If Dr. Leonie Man has a spot, Dr. Leonie Man can see him and then Dr. Leonie Man is welcome to refer him back to me for all follow ups if ok by Dr. Leonie Man, this would be considered a new patient for Dr. Leonie Man. Thanks

## 2021-07-04 NOTE — Evaluation (Signed)
Speech Language Pathology Evaluation Patient Details Name: Martin Robinson MRN: [id number] DOB: [birth year] Today's Date: 07/04/2021 Time: 1140-1155 SLP Time Calculation (min) (ACUTE ONLY): 15 min  Problem List:  Patient Active Problem List   Diagnosis Date Noted   Ischemic stroke ([place]) 07/03/2021   Dyslipidemia 07/03/2021   Melanoma of skin ([place]) 09/10/2016   Past Medical History:  Past Medical History:  Diagnosis Date   Dyslipidemia 07/03/2021   Melanoma [facility])    Past Surgical History:  Past Surgical History:  Procedure Laterality Date   MELANOMA EXCISION     HPI:  60 y/o male presented to ED on 8/26 with concern for stroke. MRI revealed acute R thalamic infarct. PMH is unremarkable   Assessment / Plan / Recommendation Clinical Impression  Pt presents with minimal to mild cognitive deficits per standardized cognitive assessment. [employer] Mental Status (SLUMS) administered: 22/30. Of note, pt did have testing in challenging environment with background noise as spouse was collaborating with medical team. Deficits exhibited in delayed recall 2/5 and mental manipulation. 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 Pt with supportive familiy avaiable at DC. Recommend supervision with complex ADLs (financial, medicine management) and outpatient SLP services for cognitive intervention if pt notices challenges with work or ADLs. Expressive and receptive language as well as motor speech skills were intact. Any further needs can be addressed as outpatient. No further acute SLP needs identified.    SLP Assessment  SLP Recommendation/Assessment: All further Speech Lanaguage Pathology  needs can be addressed in the next venue of care    Follow Up Recommendations  Outpatient SLP    Frequency and Duration           SLP Evaluation Cognition  Overall Cognitive Status: Impaired/Different from baseline Arousal/Alertness: Awake/alert Orientation  Level: Oriented X4 Memory: Impaired Memory Impairment: Decreased short term memory (2/5 delayed recall) Awareness: Appears intact Problem Solving: Appears intact Executive Function: Organizing Organizing: Impaired Organizing Impairment: Verbal complex;Functional complex Safety/Judgment: Appears intact       Comprehension  Auditory Comprehension Overall Auditory Comprehension: Appears within functional limits for tasks assessed    Expression Expression Primary Mode of Expression: Verbal Verbal Expression Overall Verbal Expression: Appears within functional limits for tasks assessed Written Expression Dominant Hand: Right   Oral / Motor  Oral Motor/Sensory Function Overall Oral Motor/Sensory Function: Within functional limits Motor Speech Overall Motor Speech: Appears within functional limits for tasks assessed   GO                    Hayden Rasmussen MA, CCC-SLP Acute Rehabilitation Services   07/04/2021, 12:10 PM

## 2021-07-04 NOTE — Care Management (Signed)
Referral made to [facility] for SLP. Patient and wife understand to call Monday to make appointment.

## 2021-07-04 NOTE — Plan of Care (Signed)
  Problem: Education: Goal: Knowledge of General Education information will improve Description: Including pain rating scale, medication(s)/side effects and non-pharmacologic comfort measures Outcome: Adequate for Discharge   

## 2021-07-06 ENCOUNTER — Ambulatory Visit: Payer: Commercial Managed Care - PPO | Attending: Internal Medicine | Admitting: Speech Pathology

## 2021-07-06 ENCOUNTER — Encounter: Payer: Self-pay | Admitting: Speech Pathology

## 2021-07-06 ENCOUNTER — Other Ambulatory Visit (HOSPITAL_COMMUNITY): Payer: Self-pay | Admitting: Internal Medicine

## 2021-07-06 ENCOUNTER — Other Ambulatory Visit: Payer: Self-pay

## 2021-07-06 ENCOUNTER — Encounter: Payer: Self-pay | Admitting: *Deleted

## 2021-07-06 ENCOUNTER — Other Ambulatory Visit (INDEPENDENT_AMBULATORY_CARE_PROVIDER_SITE_OTHER): Payer: Self-pay

## 2021-07-06 ENCOUNTER — Ambulatory Visit (INDEPENDENT_AMBULATORY_CARE_PROVIDER_SITE_OTHER): Payer: Commercial Managed Care - PPO

## 2021-07-06 DIAGNOSIS — Z0289 Encounter for other administrative examinations: Secondary | ICD-10-CM

## 2021-07-06 DIAGNOSIS — R41841 Cognitive communication deficit: Secondary | ICD-10-CM | POA: Insufficient documentation

## 2021-07-06 DIAGNOSIS — I639 Cerebral infarction, unspecified: Secondary | ICD-10-CM

## 2021-07-06 DIAGNOSIS — I4891 Unspecified atrial fibrillation: Secondary | ICD-10-CM

## 2021-07-06 DIAGNOSIS — I6381 Other cerebral infarction due to occlusion or stenosis of small artery: Secondary | ICD-10-CM

## 2021-07-06 DIAGNOSIS — D6859 Other primary thrombophilia: Secondary | ICD-10-CM

## 2021-07-06 NOTE — Progress Notes (Unsigned)
Patient enrolled for [employer] to mail a 14 day ZIO XT monitor to his home. Letter with instructions mailed to patient.

## 2021-07-06 NOTE — Therapy (Signed)
[address] [place], [place], [address] Phone: [phone]   Fax:  [phone]  Speech Language Pathology Evaluation  Patient Details  Name: Martin Robinson MRN: [id number] Date of Birth: 07-15-1961 Referring Provider (SLP): Lavina Hamman, MD   Encounter Date: 07/06/2021   End of Session - 07/06/21 1245     Visit Number 1    Number of Visits 9    Date for SLP Re-Evaluation 09/05/21    SLP Start Time 1100    SLP Stop Time  1150    SLP Time Calculation (min) 50 min    Activity Tolerance Patient tolerated treatment well             Past Medical History:  Diagnosis Date   Dyslipidemia 07/03/2021   Melanoma [facility])     Past Surgical History:  Procedure Laterality Date   MELANOMA EXCISION      There were no vitals filed for this visit.       SLP Evaluation [facility] - 07/06/21 1106       SLP Visit Information   SLP Received On 07/06/21    Referring Provider (SLP) Lavina Hamman, MD    Onset Date 07/04/21    Medical Diagnosis CVA      Subjective   Patient/Family Stated Goal To play to golf and get back to work.      Pain Assessment   Pain Score 0-No pain      General Information   HPI 60 y/o male presented to ED on 8/26 with concern for stroke. MRI revealed acute R thalamic infarct. PMH is unremarkable.      Balance Screen   Has the patient fallen in the past 6 months Yes    How many times? 1   During incident   Has the patient had a decrease in activity level because of a fear of falling?  No    Is the patient reluctant to leave their home because of a fear of falling?  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 Comprehension   Overall Auditory Comprehension Appears within functional limits for tasks assessed      Verbal Expression   Overall Verbal Expression Appears within functional limits for tasks assessed      Written Expression   Dominant Hand Right      Motor Speech   Overall Motor Speech Appears within functional limits for tasks assessed      Standardized Assessments   Standardized Assessments  Other Assessment   SLUMS, CLQT subtests             SLU Mental Status (SLUMS Examination)  Orientation: 3/3 Delayed Recall w/ Interference: 4/5 Numeric Calculation and Registration: 3/3 Immediate Recall w/ Interference (Generative naming): 3/3 Registration and Digit Span: 2/2 Visual Spatial/Exec Functioning: 5/6 Executive Functioning/Extrapolation:  6/8  Score: 26/30  SLP Education - 07/06/21 1245     Education Details cognitive-communication impairment    Person(s) Educated Patient;Spouse    Methods Explanation;Demonstration;Handout    Comprehension Verbalized understanding;Need further instruction              SLP Short Term Goals - 07/06/21 1300       SLP SHORT TERM GOAL #1   Title Pt will recall 2-3 attention strategies that assist in reduction of cognitive fatigue across 3 sessions.    Time 4    Period Weeks    Status New    Target Date 08/03/21      SLP SHORT TERM GOAL #2   Title Pt will recall 2-3 memory strategies that assist in recall of important information across 3 sessions.    Time 4    Period Weeks    Status New    Target Date 08/03/21              SLP Long Term Goals - 07/06/21 1258       SLP LONG TERM GOAL #1   Title Pt and wife will report implementation of 2-3 attention strategies to assist in reduction of cognitive fatigue.     Time 8    Period Weeks    Status New    Target Date 09/05/21      SLP LONG TERM GOAL #2   Title Pt and wife will report implementation of 2-3 memory strategies to assist with recall of important information.    Time 8    Period Weeks    Status New    Target Date 09/05/21              Plan - 07/06/21 1246     Clinical Impression Statement Pt is a 60 yo male who was seen for OP evaluation this date post CVA on 07/03/21. Pt was accompanied by wife, who works in the ED as a Marine scientist. Pt endorses cognitive changes such as memory and attention. Wife added that she felt he was having difficulty with time management. SLP completed cognitive screen, SLUMS, in which the patient scored a 26/30 which indicates mild impairments. SLP assessed using subtests of CLQT. Pt scored the following: Personal Facts 8/8; Confrontation Naming 8/9; Clock Drawing 12/13; Story Retell 6/10. Pt required repetition of questions occasionally throughout assessment. Pt reports premorbid deficits in attention and self-monitoring for appropriateness of verbal output ("no filter") which have been exacerbated post CVA. Most deficits appear to be in [facility] with mild-to-moderate attention deficits. SLP, pt, and pt wife are all in agreement that pt could benefit from strategies for attention, cognitive fatigue, and memory aid.  SLP rec ST services to address mild cognitive-communication impairment to increase pt's confidence for return to work and iADLs.    Speech Therapy Frequency 1x /week    Duration 8 weeks    Treatment/Interventions Compensatory strategies;Cueing hierarchy;Functional tasks;Patient/family education;Environmental controls;Cognitive reorganization;Compensatory techniques;Internal/external aids;SLP instruction and feedback    Potential to Achieve Goals Good    Consulted and Agree with Plan of Care Patient;Family member/caregiver    Family Member Consulted Wife, Stanton Kidney             Patient will benefit from skilled  therapeutic intervention in order to improve the following deficits and impairments:   Cognitive communication deficit    Problem List Patient Active Problem List   Diagnosis Date Noted   Ischemic stroke ([place]) 07/03/2021   Dyslipidemia 07/03/2021   Melanoma of skin ([place]) 09/10/2016    Jodi Mourning  Konrad Saha MS, CCC-SLP, CBIS  07/06/2021, 1:09 PM  [address] [place], [place], [address] Phone: [phone]   Fax:  [phone]  Name: Martin Robinson MRN: [id number] Date of Birth: 04-Jan-1961

## 2021-07-07 ENCOUNTER — Other Ambulatory Visit: Payer: Self-pay | Admitting: Neurology

## 2021-07-07 ENCOUNTER — Telehealth: Payer: Self-pay | Admitting: Neurology

## 2021-07-07 ENCOUNTER — Telehealth: Payer: Self-pay

## 2021-07-07 DIAGNOSIS — I6310 Cerebral infarction due to embolism of unspecified precerebral artery: Secondary | ICD-10-CM

## 2021-07-07 DIAGNOSIS — I639 Cerebral infarction, unspecified: Secondary | ICD-10-CM

## 2021-07-07 NOTE — Telephone Encounter (Signed)
Martin Robinson is calling in regards to her husbands AVS from the ED stating to get a heart monitor from our office. Requesting a callback to discuss. Please advise.

## 2021-07-07 NOTE — Telephone Encounter (Signed)
Young patient with stroke. I saw him in the hospital this weekend. Dr. Junius Roads would like myself or Dr. Leonie Man to see him in follow up. And I am referring to Dr. Rayann Heman for TEE and loop (fyi Dr. Rayann Heman thanks)    Martin Robinson or Martin Robinson: Patient needs an appointment with myself or dr Leonie Man in the next 4-6 weeks. I would prefer first appointment with our vascular specialist dr Leonie Man since he is so young, I know they want to see me but they can follow up with me afterwards. Also I have ordered a large panel of hypercoagulable labs if he can come to the office prior and have them completed (give him instructions) prior to appointment. If he is placed on my schedule give him extra time with me such as end of day or an emg spot(wife is a nurse here at [facility]  believe). If Dr. Leonie Man has a spot, Dr. Leonie Man can see him and then Dr. Leonie Man is welcome to refer him back to me for all follow ups if ok by Dr. Leonie Man, this would be considered a new patient for Dr. Leonie Man.

## 2021-07-08 NOTE — Discharge Summary (Signed)
[facility] Discharge Summary   Patient: Martin Robinson [id number]  PCP: Eunice Blase, MD  Date of admission: 07/03/2021   Date of discharge: 07/04/2021      Discharge Diagnoses:  Principal Problem:   Ischemic stroke [facility]) Active Problems:   Dyslipidemia   Admitted From: Home Disposition:  Home   Recommendations for Outpatient Follow-up:  PCP: Follow-up with PCP in 1 week.  Neurology in 1 month.  Cardiology for TEE   Follow-up Information     Hilts, Michael, MD. Schedule an appointment as soon as possible for a visit in 1 week(s).   Specialty: Family Medicine Contact information: [address], [place] [address] [phone]         [facility]. Schedule an appointment as soon as possible for a visit in 1 month(s).   Specialty: Neurology Contact information: [address] [place] Follow up.   Specialty: Rehabilitation Why: Call office Monday to schedule Contact information: [place] [id number] [place] [phone]               Discharge Instructions     Ambulatory referral to Neurology   Complete by: As directed    An appointment is requested in approximately: 4 weeks   Ambulatory referral to Speech Therapy   Complete by: As directed    Diet - low sodium heart healthy   Complete by: As directed    Increase activity slowly   Complete by: As directed        Diet recommendation: Cardiac diet  Activity: The patient is advised to gradually reintroduce usual activities, as tolerated  Discharge Condition: stable  Code Status: Full code   History of present illness: As per the H and P dictated on admission, "Martin Robinson is a 60 y.o. male with medical history significant of skin cancers presenting with dysarthria and L facial droop.  They  were visiting their son in [place].  Monday night things were fine.  They got up the next AM - he slept late which is out of character for him.  He laid around, mild L eyelid droop.  He did walk a bit off balance.  He leaned to the left and walked diagonally.  He toppled over on the sidewalk.  He was slurring speech with L lip drooping and slurred speech.  He went to the ER there and then drove 3 hours to another hospital.  Labs looked good.  CT was read as negative - but was present.  His symptoms had mostly resolved - mild ataxia and lip droop.  They thought probably a TIA.  However, on Wednesday he was still ataxic, fell once, more somnolent than usual.  They arranged to come home early, got into [place] at midnight and came straight to ER.  His symptoms are now mostly resolved with mild ataxia as a residual.  "  Hospital Course:  Summary of his active problems in the hospital is as following. Acute right thalamic infarct likely lacunar.  Potentially embolic. CT head right thalamic infarct. MRI acute right thalamic infarct. MRA no large vessel occlusion. Echocardiogram shows preserved EF without any source of cardiac thrombus or embolization. LDL 115 on Lipitor 40 mg. Hemoglobin A1c 5.7. Prior to admission no medication.  Now on aspirin and Plavix. Continue aspirin Plavix for 3 weeks followed by aspirin alone. PT OT recommends outpatient  therapy. TEE is recommended although outpatient. 30-day event monitor is also recommended. Requested cardiology recs .  Essential hypertension. Blood pressure stable.   Hyperlipidemia. LDL 115.  Lipitor 80 mg at   Body mass index is 20.84 kg/m.    Nutrition Interventions:        Patient was seen by physical therapy, who recommended No therapy needed on discharge,. On the day of the discharge the patient's vitals were stable, and no other new acute medical condition were reported. The patient was felt safe to be discharge at Home with  outpatient therapy.  Consultants: Neurology Procedures: Echocardiogram  DISCHARGE MEDICATION: Allergies as of 07/04/2021   No Known Allergies      Medication List     TAKE these medications    aspirin 81 MG EC tablet Take 1 tablet (81 mg total) by mouth daily. Swallow whole.   atorvastatin 40 MG tablet Commonly known as: LIPITOR Take 1 tablet (40 mg total) by mouth daily.   clopidogrel 75 MG tablet Commonly known as: PLAVIX Take 1 tablet (75 mg total) by mouth daily for 21 days.   co-enzyme Q-10 30 MG capsule Take 30 mg by mouth daily.   Fish Oil 1000 MG Caps Take 1,000 mg by mouth daily.   glucosamine-chondroitin 500-400 MG tablet Take 1 tablet by mouth 3 (three) times daily.   Multi For Him 50+ Tabs Take 1 tablet by mouth daily.   NIACIN FLUSH FREE PO Take 500 mg by mouth daily.   OVER THE COUNTER MEDICATION Take 6 tablets by mouth daily. 3 fruit & 3 veggie complete balance vitamins        Discharge Exam: Filed Weights   07/03/21 0237  Weight: 83.9 kg   Vitals:   07/04/21 0400 07/04/21 0845  BP: 113/80 119/82  Pulse: 78 74  Resp: 18 18  Temp: 98.1 F (36.7 C) 98.3 F (36.8 C)  SpO2: 99% 98%   General: Appear in mild distress, no Rash; Oral Mucosa Clear, moist. no Abnormal Neck Mass Or lumps, Conjunctiva normal  Cardiovascular: S1 and S2 Present, no Murmur, Respiratory: good respiratory effort, Bilateral Air entry present and CTA, no Crackles, no wheezes Abdomen: Bowel Sound present, Soft and no tenderness Extremities: no Pedal edema Neurology: alert and oriented to time, place, and person affect appropriate. no new focal deficit Gait not checked due to patient safety concerns    The results of significant diagnostics from this hospitalization (including imaging, microbiology, ancillary and laboratory) are listed below for reference.    Significant Diagnostic Studies: CT Angio Head W or Wo Contrast  Addendum Date: 07/03/2021   ADDENDUM  REPORT: 07/03/2021 04:53 ADDENDUM: Study discussed by telephone with Dr. Addison Lank on 07/03/2021 at 0446 hours. Electronically Signed   By: Genevie Ann M.D.   On: 07/03/2021 04:53   Result Date: 07/03/2021 CLINICAL DATA:  60 year old male with altered mental status three days after a fall. Slurred speech. EXAM: CT ANGIOGRAPHY HEAD AND NECK TECHNIQUE: Multidetector CT imaging of the head and neck was performed using the standard protocol during bolus administration of intravenous contrast. Multiplanar CT image reconstructions and MIPs were obtained to evaluate the vascular anatomy. Carotid stenosis measurements (when applicable) are obtained utilizing NASCET criteria, using the distal internal carotid diameter as the denominator. CONTRAST:  139m OMNIPAQUE IOHEXOL 350 MG/ML SOLN COMPARISON:  None. FINDINGS: CT HEAD Brain: Conspicuous oval hypodensity in the anterior right thalamus measures about 10 mm diameter and 2 cm in length (series 3, image 13 and  series 9, image 25). No associated hemorrhage. No regional mass effect. Elsewhere gray-white matter differentiation is within normal limits. No midline shift, ventriculomegaly, mass effect, evidence of mass lesion, intracranial hemorrhage or evidence of cortically based acute infarction. Calvarium and skull base: Intact, negative. Paranasal sinuses: Visualized paranasal sinuses and mastoids are clear. Orbits: Visualized orbits and scalp soft tissues are within normal limits. CTA NECK Skeleton: No acute osseous abnormality identified. Minimal degenerative changes in the visible spine. Upper chest: Negative. Other neck: Negative. Aortic arch: 3 vessel arch configuration.  No arch atherosclerosis. Right carotid system: Negative right CCA and right carotid bifurcation. Tortuous right ICA distal to the bulb, but no plaque or stenosis to the skull base. Left carotid system: Negative. Vertebral arteries: Normal proximal right subclavian artery and right vertebral artery  origin. Right vertebral artery is diminutive and appears non dominant throughout the neck but remains patent to the skull base with no plaque or stenosis identified. Proximal left subclavian artery and left vertebral artery origin are normal. The left vertebral artery appears dominant, but with a relatively normal caliber. The left vertebral is patent to the skull base without plaque or stenosis. CTA HEAD Posterior circulation: Patent distal vertebral arteries to the vertebrobasilar junction without plaque or stenosis. Dominant left V4. Mildly fenestrated vertebrobasilar junction (normal variant). Patent right PICA origin. Left AICA may be dominant and appears patent. Patent although diminutive basilar artery which functionally terminates at the SCA is. There are fetal type bilateral PCA origins. But furthermore, there is evidence of a small right PCA P1 segment arising from the basilar tip but abruptly terminating as seen on series 15, image 21. Bilateral PCA branches otherwise appear within normal limits. Anterior circulation: Both ICA siphons are patent with no plaque or stenosis. Bilateral ophthalmic and posterior communicating artery origins are normal. Patent carotid termini. Normal MCA and ACA origins. Normal anterior communicating artery. Bilateral ACA branches are within normal limits. Left MCA M1 segment, bifurcation and branches are within normal limits. Right MCA M1 segment, bifurcation and branches are within normal limits. Venous sinuses: Patent. Anatomic variants: Diminutive vertebrobasilar system on the basis of fetal type bilateral PCA origins. Dominant left and diminutive right vertebral arteries. Review of the MIP images confirms the above findings IMPRESSION: 1. Positive for a Right Thalamic lacunar infarct. No associated hemorrhage or mass effect. Although age indeterminate by CT, this is most likely the symptomatic abnormality as CTA further demonstrates a partially occluded Right PCA P1  segment (non-dominant due to fetal PCA origins) correlating with Right Thalamostriate Artery ischemia. 2. But otherwise normal arterial findings in the head and neck. No atherosclerosis or atherosclerotic stenosis identified. 3. And otherwise negative CT appearance of the brain. Electronically Signed: By: Genevie Ann M.D. On: 07/03/2021 04:41   CT Angio Neck W and/or Wo Contrast  Addendum Date: 07/03/2021   ADDENDUM REPORT: 07/03/2021 04:53 ADDENDUM: Study discussed by telephone with Dr. Addison Lank on 07/03/2021 at 0446 hours. Electronically Signed   By: Genevie Ann M.D.   On: 07/03/2021 04:53   Result Date: 07/03/2021 CLINICAL DATA:  60 year old male with altered mental status three days after a fall. Slurred speech. EXAM: CT ANGIOGRAPHY HEAD AND NECK TECHNIQUE: Multidetector CT imaging of the head and neck was performed using the standard protocol during bolus administration of intravenous contrast. Multiplanar CT image reconstructions and MIPs were obtained to evaluate the vascular anatomy. Carotid stenosis measurements (when applicable) are obtained utilizing NASCET criteria, using the distal internal carotid diameter as the denominator. CONTRAST:  126m OMNIPAQUE IOHEXOL 350 MG/ML SOLN COMPARISON:  None. FINDINGS: CT HEAD Brain: Conspicuous oval hypodensity in the anterior right thalamus measures about 10 mm diameter and 2 cm in length (series 3, image 13 and series 9, image 25). No associated hemorrhage. No regional mass effect. Elsewhere gray-white matter differentiation is within normal limits. No midline shift, ventriculomegaly, mass effect, evidence of mass lesion, intracranial hemorrhage or evidence of cortically based acute infarction. Calvarium and skull base: Intact, negative. Paranasal sinuses: Visualized paranasal sinuses and mastoids are clear. Orbits: Visualized orbits and scalp soft tissues are within normal limits. CTA NECK Skeleton: No acute osseous abnormality identified. Minimal degenerative  changes in the visible spine. Upper chest: Negative. Other neck: Negative. Aortic arch: 3 vessel arch configuration.  No arch atherosclerosis. Right carotid system: Negative right CCA and right carotid bifurcation. Tortuous right ICA distal to the bulb, but no plaque or stenosis to the skull base. Left carotid system: Negative. Vertebral arteries: Normal proximal right subclavian artery and right vertebral artery origin. Right vertebral artery is diminutive and appears non dominant throughout the neck but remains patent to the skull base with no plaque or stenosis identified. Proximal left subclavian artery and left vertebral artery origin are normal. The left vertebral artery appears dominant, but with a relatively normal caliber. The left vertebral is patent to the skull base without plaque or stenosis. CTA HEAD Posterior circulation: Patent distal vertebral arteries to the vertebrobasilar junction without plaque or stenosis. Dominant left V4. Mildly fenestrated vertebrobasilar junction (normal variant). Patent right PICA origin. Left AICA may be dominant and appears patent. Patent although diminutive basilar artery which functionally terminates at the SCA is. There are fetal type bilateral PCA origins. But furthermore, there is evidence of a small right PCA P1 segment arising from the basilar tip but abruptly terminating as seen on series 15, image 21. Bilateral PCA branches otherwise appear within normal limits. Anterior circulation: Both ICA siphons are patent with no plaque or stenosis. Bilateral ophthalmic and posterior communicating artery origins are normal. Patent carotid termini. Normal MCA and ACA origins. Normal anterior communicating artery. Bilateral ACA branches are within normal limits. Left MCA M1 segment, bifurcation and branches are within normal limits. Right MCA M1 segment, bifurcation and branches are within normal limits. Venous sinuses: Patent. Anatomic variants: Diminutive vertebrobasilar  system on the basis of fetal type bilateral PCA origins. Dominant left and diminutive right vertebral arteries. Review of the MIP images confirms the above findings IMPRESSION: 1. Positive for a Right Thalamic lacunar infarct. No associated hemorrhage or mass effect. Although age indeterminate by CT, this is most likely the symptomatic abnormality as CTA further demonstrates a partially occluded Right PCA P1 segment (non-dominant due to fetal PCA origins) correlating with Right Thalamostriate Artery ischemia. 2. But otherwise normal arterial findings in the head and neck. No atherosclerosis or atherosclerotic stenosis identified. 3. And otherwise negative CT appearance of the brain. Electronically Signed: By: HGenevie AnnM.D. On: 07/03/2021 04:41   MR ANGIO HEAD WO CONTRAST  Result Date: 07/03/2021 CLINICAL DATA:  Stroke follow-up. EXAM: MRI HEAD WITHOUT CONTRAST MRA HEAD WITHOUT CONTRAST TECHNIQUE: Multiplanar, multi-echo pulse sequences of the brain and surrounding structures were acquired without intravenous contrast. Angiographic images of the Circle of Willis were acquired using MRA technique without intravenous contrast. COMPARISON:  Head CT July 03, 2021. FINDINGS: MRI HEAD FINDINGS Brain: Area of restricted diffusion involving the anterior aspect of the right thalamus, corresponding to hypodensity seen on prior CT. No other focus of restricted  diffusion seen. No hemorrhage, hydrocephalus, extra-axial collection mass. Vascular: Normal flow voids. Skull and upper cervical spine: Normal marrow signal. Sinuses/Orbits: No acute or significant finding. Other: None. MRA HEAD FINDINGS Anterior circulation: The visualized portions of the distal cervical and intracranial internal carotid arteries are widely patent with normal flow related enhancement. The bilateral anterior cerebral arteries and middle cerebral arteries are widely patent with antegrade flow without high-grade flow-limiting stenosis or proximal  branch occlusion. Prominent bilateral posterior communicating arteries. No intracranial aneurysm within the anterior circulation. Posterior circulation: The vertebral arteries are widely patent with antegrade flow. The posterior inferior cerebral arteries are normal. Vertebrobasilar junction and basilar artery are widely patent with antegrade flow without evidence of basilar stenosis or aneurysm. Hypoplastic right P1/PCA and severely hypoplastic versus aplastic left P1/PCA with the bilateral posterior cerebral arteries supplied predominantly by prominent posterior communicating arteries (fetal PCAs). No intracranial aneurysm within the posterior circulation. Anatomic variants: Bilateral fetal PCAs. IMPRESSION: 1. Acute right thalamic infarct. No evidence of hemorrhagic transformation. 2. No intracranial large vessel occlusion or hemodynamically significant stenosis. 3. 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 Electronically Signed   By: Pedro Earls M.D.   On: 07/03/2021 14:52   MR BRAIN WO CONTRAST  Result Date: 07/03/2021 CLINICAL DATA:  Stroke follow-up. EXAM: MRI HEAD WITHOUT CONTRAST MRA HEAD WITHOUT CONTRAST TECHNIQUE: Multiplanar, multi-echo pulse sequences of the brain and surrounding structures were acquired without intravenous contrast. Angiographic images of the Circle of Willis were acquired using MRA technique without intravenous contrast. COMPARISON:  Head CT July 03, 2021. FINDINGS: MRI HEAD FINDINGS Brain: Area of restricted diffusion involving the anterior aspect of the right thalamus, corresponding to hypodensity seen on prior CT. No other focus of restricted diffusion seen. No hemorrhage, hydrocephalus, extra-axial collection mass. Vascular: Normal flow voids. Skull and upper cervical spine: Normal marrow signal. Sinuses/Orbits: No acute or significant finding. Other: None. MRA HEAD FINDINGS Anterior  circulation: The visualized portions of the distal cervical and intracranial internal carotid arteries are widely patent with normal flow related enhancement. The bilateral anterior cerebral arteries and middle cerebral arteries are widely patent with antegrade flow without high-grade flow-limiting stenosis or proximal branch occlusion. Prominent bilateral posterior communicating arteries. No intracranial aneurysm within the anterior circulation. Posterior circulation: The vertebral arteries are widely patent with antegrade flow. The posterior inferior cerebral arteries are normal. Vertebrobasilar junction and basilar artery are widely patent with antegrade flow without evidence of basilar stenosis or aneurysm. Hypoplastic right P1/PCA and severely hypoplastic versus aplastic left P1/PCA with the bilateral posterior cerebral arteries supplied predominantly by prominent posterior communicating arteries (fetal PCAs). No intracranial aneurysm within the posterior circulation. Anatomic variants: Bilateral fetal PCAs. IMPRESSION: 1. Acute right thalamic infarct. No evidence of hemorrhagic transformation. 2. No intracranial large vessel occlusion or hemodynamically significant stenosis. 3. 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 Electronically Signed   By: Pedro Earls M.D.   On: 07/03/2021 14:52   ECHOCARDIOGRAM COMPLETE  Result Date: 07/03/2021    ECHOCARDIOGRAM REPORT   Patient Name:   JOSEGUADALUPE BISHARA Date of Exam: 07/03/2021 Medical Rec #:  [id number]         Height:       79.0 in Accession #:    [id number]        Weight:       185.0 lb Date of Birth:  08-01-1961  BSA:          2.205 m Patient Age:    38 years          BP:           115/82 mmHg Patient Gender: M                 HR:           72 bpm. Exam Location:  Inpatient Procedure: 2D Echo, Cardiac Doppler and Color Doppler Indications:    Stroke  History:         Patient has no prior history of Echocardiogram examinations.  Sonographer:    Merrie Roof RDCS Referring Phys: [facility]  1. Left ventricular ejection fraction, by estimation, is 60 to 65%. The left ventricle has normal function. The left ventricle has no regional wall motion abnormalities. There is mild left ventricular hypertrophy. Left ventricular diastolic parameters were normal.  2. Right ventricular systolic function is normal. The right ventricular size is normal. Tricuspid regurgitation signal is inadequate for assessing PA pressure.  3. The mitral valve is normal in structure. Trivial mitral valve regurgitation. No evidence of mitral stenosis.  4. The aortic valve is tricuspid. Aortic valve regurgitation is not visualized. No aortic stenosis is present.  5. The inferior vena cava is normal in size with greater than 50% respiratory variability, suggesting right atrial pressure of 3 mmHg. Conclusion(s)/Recommendation(s): No intracardiac source of embolism detected on this transthoracic study. A transesophageal echocardiogram is recommended to exclude cardiac source of embolism if clinically indicated. FINDINGS  Left Ventricle: Left ventricular ejection fraction, by estimation, is 60 to 65%. The left ventricle has normal function. The left ventricle has no regional wall motion abnormalities. The left ventricular internal cavity size was normal in size. There is  mild left ventricular hypertrophy. Left ventricular diastolic parameters were normal. Right Ventricle: The right ventricular size is normal. No increase in right ventricular wall thickness. Right ventricular systolic function is normal. Tricuspid regurgitation signal is inadequate for assessing PA pressure. Left Atrium: Left atrial size was normal in size. Right Atrium: Right atrial size was normal in size. Pericardium: There is no evidence of pericardial effusion. Mitral Valve: The mitral valve is normal in structure. Trivial  mitral valve regurgitation. No evidence of mitral valve stenosis. Tricuspid Valve: The tricuspid valve is normal in structure. Tricuspid valve regurgitation is trivial. No evidence of tricuspid stenosis. Aortic Valve: The aortic valve is tricuspid. Aortic valve regurgitation is not visualized. No aortic stenosis is present. Aortic valve mean gradient measures 3.0 mmHg. Aortic valve peak gradient measures 5.4 mmHg. Aortic valve area, by VTI measures 3.60 cm. Pulmonic Valve: The pulmonic valve was normal in structure. Pulmonic valve regurgitation is trivial. No evidence of pulmonic stenosis. Aorta: The aortic root is normal in size and structure. Venous: The inferior vena cava is normal in size with greater than 50% respiratory variability, suggesting right atrial pressure of 3 mmHg. IAS/Shunts: No atrial level shunt detected by color flow Doppler.  LEFT VENTRICLE PLAX 2D LVIDd:         5.00 cm  Diastology LVIDs:         3.30 cm  LV e' medial:    10.30 cm/s LV PW:         1.20 cm  LV E/e' medial:  6.2 LV IVS:        0.80 cm  LV e' lateral:   13.10 cm/s LVOT diam:     2.30 cm  LV E/e' lateral: 4.8 LV SV:         86 LV SV Index:   39 LVOT Area:     4.15 cm  RIGHT VENTRICLE RV Basal diam:  3.70 cm RV S prime:     11.90 cm/s TAPSE (M-mode): 2.0 cm LEFT ATRIUM             Index       RIGHT ATRIUM           Index LA diam:        3.90 cm 1.77 cm/m  RA Area:     16.30 cm LA Vol (A2C):   53.1 ml 24.09 ml/m RA Volume:   39.40 ml  17.87 ml/m LA Vol (A4C):   43.4 ml 19.69 ml/m LA Biplane Vol: 50.3 ml 22.82 ml/m  AORTIC VALVE AV Area (Vmax):    3.90 cm AV Area (Vmean):   3.70 cm AV Area (VTI):     3.60 cm AV Vmax:           116.00 cm/s AV Vmean:          78.300 cm/s AV VTI:            0.240 m AV Peak Grad:      5.4 mmHg AV Mean Grad:      3.0 mmHg LVOT Vmax:         109.00 cm/s LVOT Vmean:        69.700 cm/s LVOT VTI:          0.208 m LVOT/AV VTI ratio: 0.87  AORTA Ao Root diam: 3.30 cm Ao Asc diam:  3.00 cm MITRAL  VALVE MV Area (PHT): 3.48 cm    SHUNTS MV Decel Time: 218 msec    Systemic VTI:  0.21 m MV E velocity: 63.40 cm/s  Systemic Diam: 2.30 cm MV A velocity: 58.20 cm/s MV E/A ratio:  1.09 Cherlynn Kaiser MD Electronically signed by Cherlynn Kaiser MD Signature Date/Time: 07/03/2021/2:54:53 PM    Final    VAS [place] LOWER EXTREMITY VENOUS (DVT)  Result Date: 07/05/2021  Lower Venous DVT Study Patient Name:  BLADYN ELKIND  Date of Exam:   07/04/2021 Medical Rec #: [id number]          Accession #:    [id number] Date of Birth: 1961/10/18           Patient Gender: M Patient Age:   86 years Exam Location[facility]  [facility] Procedure:      VAS [place] LOWER EXTREMITY VENOUS (DVT) Referring Phys: Laurey Morale --------------------------------------------------------------------------------  Indications: Stroke.  Comparison Study: No prior study Performing Technologist: Sharion Dove RVS  Examination Guidelines: A complete evaluation includes B-mode imaging, spectral Doppler, color Doppler, and power Doppler as needed of all accessible portions of each vessel. Bilateral testing is considered an integral part of a complete examination. Limited examinations for reoccurring indications may be performed as noted. The reflux portion of the exam is performed with the patient in reverse Trendelenburg.  +---------+---------------+---------+-----------+----------+--------------+ RIGHT    CompressibilityPhasicitySpontaneityPropertiesThrombus Aging +---------+---------------+---------+-----------+----------+--------------+ CFV      Full           Yes      Yes                                 +---------+---------------+---------+-----------+----------+--------------+ SFJ      Full                                                        +---------+---------------+---------+-----------+----------+--------------+  FV Prox  Full                                                         +---------+---------------+---------+-----------+----------+--------------+ FV Mid   Full                                                        +---------+---------------+---------+-----------+----------+--------------+ FV DistalFull                                                        +---------+---------------+---------+-----------+----------+--------------+ PFV      Full                                                        +---------+---------------+---------+-----------+----------+--------------+ POP      Full           Yes      Yes                                 +---------+---------------+---------+-----------+----------+--------------+ PTV      Full                                                        +---------+---------------+---------+-----------+----------+--------------+ PERO     Full                                                        +---------+---------------+---------+-----------+----------+--------------+   +---------+---------------+---------+-----------+----------+--------------+ LEFT     CompressibilityPhasicitySpontaneityPropertiesThrombus Aging +---------+---------------+---------+-----------+----------+--------------+ CFV      Full           Yes      Yes                                 +---------+---------------+---------+-----------+----------+--------------+ SFJ      Full                                                        +---------+---------------+---------+-----------+----------+--------------+ FV Prox  Full                                                        +---------+---------------+---------+-----------+----------+--------------+  FV Mid   Full                                                        +---------+---------------+---------+-----------+----------+--------------+ FV DistalFull                                                         +---------+---------------+---------+-----------+----------+--------------+ PFV      Full                                                        +---------+---------------+---------+-----------+----------+--------------+ POP      Full           Yes      Yes                                 +---------+---------------+---------+-----------+----------+--------------+ PTV      Full                                                        +---------+---------------+---------+-----------+----------+--------------+ PERO     Full                                                        +---------+---------------+---------+-----------+----------+--------------+     Summary: BILATERAL: - No evidence of deep vein thrombosis seen in the lower extremities, bilaterally. -No evidence of popliteal cyst, bilaterally.   *See table(s) above for measurements and observations. Electronically signed by Monica Martinez MD on 07/05/2021 at 2:25:18 PM.    Final     Microbiology: Recent Results (from the past 240 hour(s))  Resp Panel by RT-PCR (Flu A&B, Covid) Nasopharyngeal Swab     Status: None   Collection Time: 07/03/21  4:55 AM   Specimen: Nasopharyngeal Swab; Nasopharyngeal(NP) swabs in vial transport medium  Result Value Ref Range Status   SARS Coronavirus 2 by RT PCR NEGATIVE NEGATIVE Final    Comment: (NOTE) SARS-CoV-2 target nucleic acids are NOT DETECTED.  The SARS-CoV-2 RNA is generally detectable in upper respiratory specimens during the acute phase of infection. The lowest concentration of SARS-CoV-2 viral copies this assay can detect is 138 copies/mL. A negative result does not preclude SARS-Cov-2 infection and should not be used as the sole basis for treatment or other patient management decisions. A negative result may occur with  improper specimen collection/handling, submission of specimen other than nasopharyngeal swab, presence of viral mutation(s) within the areas targeted by  this assay, and inadequate number of viral copies(<138 copies/mL). A negative result must be combined with clinical observations, patient history, and epidemiological information. The expected result is Negative.  Fact Sheet for Patients:  EntrepreneurPulse.com.au  Fact Sheet for Healthcare Providers:  IncredibleEmployment.be  This test is no t yet approved or cleared by the [place] FDA and  has been authorized for detection and/or diagnosis of SARS-CoV-2 by FDA under an Emergency Use Authorization (EUA). This EUA will remain  in effect (meaning this test can be used) for the duration of the COVID-19 declaration under Section 564(b)(1) of the Act, 21 U.S.C.section 360bbb-3(b)(1), unless the authorization is terminated  or revoked sooner.       Influenza A by PCR NEGATIVE NEGATIVE Final   Influenza B by PCR NEGATIVE NEGATIVE Final    Comment: (NOTE) The Xpert Xpress SARS-CoV-2/FLU/RSV plus assay is intended as an aid in the diagnosis of influenza from Nasopharyngeal swab specimens and should not be used as a sole basis for treatment. Nasal washings and aspirates are unacceptable for Xpert Xpress SARS-CoV-2/FLU/RSV testing.  Fact Sheet for Patients: EntrepreneurPulse.com.au  Fact Sheet for Healthcare Providers: IncredibleEmployment.be  This test is not yet approved or cleared by the [place] FDA and has been authorized for detection and/or diagnosis of SARS-CoV-2 by FDA under an Emergency Use Authorization (EUA). This EUA will remain in effect (meaning this test can be used) for the duration of the COVID-19 declaration under Section 564(b)(1) of the Act, 21 U.S.C. section 360bbb-3(b)(1), unless the authorization is terminated or revoked.  Performed at [facility], [address] [place], [place] [address]      Labs: CBC: Recent Labs  Lab 07/03/21 0240  WBC 6.1   HGB 13.6  HCT 40.7  MCV 91.1  PLT [id number]   Basic Metabolic Panel: Recent Labs  Lab 07/03/21 0240  NA 136  K 3.5  CL 101  CO2 28  GLUCOSE 94  BUN 15  CREATININE 1.09  CALCIUM 9.3   Liver Function Tests: Recent Labs  Lab 07/03/21 0240  AST 18  ALT 17  ALKPHOS 56  BILITOT 0.5  PROT 6.8  ALBUMIN 4.2   CBG: No results for input(s): GLUCAP in the last 168 hours.  Time spent: 35 minutes  Signed:  Berle Mull  [facility] 07/04/2021

## 2021-07-11 LAB — LUPUS ANTICOAGULANT
Dilute Viper Venom Time: 34.8 s (ref 0.0–47.0)
PTT Lupus Anticoagulant: 30.7 s (ref 0.0–51.9)
Thrombin Time: 19.1 s (ref 0.0–23.0)
dPT Confirm Ratio: 1.05 Ratio (ref 0.00–1.34)
dPT: 35.4 s (ref 0.0–47.6)

## 2021-07-11 LAB — PROTEIN C, TOTAL: Protein C Antigen: 101 % (ref 60–150)

## 2021-07-11 LAB — ANTITHROMBIN III: AntiThromb III Func: 111 % (ref 75–135)

## 2021-07-11 LAB — HOMOCYSTEINE: Homocysteine: 13 umol/L (ref 0.0–14.5)

## 2021-07-11 LAB — PROTEIN C ACTIVITY: Protein C Activity: 121 % (ref 73–180)

## 2021-07-11 LAB — FACTOR V LEIDEN

## 2021-07-11 LAB — BETA-2-GLYCOPROTEIN I ABS, IGG/M/A
Beta-2 Glyco 1 IgA: <9 GPI IgA units (ref 0–25)
Beta-2 Glyco 1 IgM: <9 GPI IgM units (ref 0–32)
Beta-2 Glyco I IgG: <9 GPI IgG units (ref 0–20)

## 2021-07-11 LAB — PROTEIN S, TOTAL: Protein S Ag, Total: 82 % (ref 60–150)

## 2021-07-11 LAB — PROTEIN S ACTIVITY: Protein S Activity: 117 % (ref 63–140)

## 2021-07-11 LAB — PROTHROMBIN GENE MUTATION

## 2021-07-13 ENCOUNTER — Other Ambulatory Visit: Payer: Self-pay | Admitting: Neurology

## 2021-07-13 DIAGNOSIS — I4891 Unspecified atrial fibrillation: Secondary | ICD-10-CM

## 2021-07-13 DIAGNOSIS — I6310 Cerebral infarction due to embolism of unspecified precerebral artery: Secondary | ICD-10-CM

## 2021-07-13 DIAGNOSIS — I639 Cerebral infarction, unspecified: Secondary | ICD-10-CM | POA: Diagnosis not present

## 2021-07-15 ENCOUNTER — Ambulatory Visit: Payer: Commercial Managed Care - PPO | Attending: Internal Medicine | Admitting: Speech Pathology

## 2021-07-15 ENCOUNTER — Other Ambulatory Visit: Payer: Self-pay

## 2021-07-15 ENCOUNTER — Encounter: Payer: Self-pay | Admitting: Speech Pathology

## 2021-07-15 DIAGNOSIS — R41841 Cognitive communication deficit: Secondary | ICD-10-CM | POA: Insufficient documentation

## 2021-07-15 NOTE — Therapy (Signed)
[place]. [place], [place], [address] Phone: [phone]   Fax:  [phone]  Speech Language Pathology Treatment  Patient Details  Name: Martin Robinson MRN: [id number] Date of Birth: 12/23/1960 Referring Provider (SLP): Lavina Hamman, MD   Encounter Date: 07/15/2021   End of Session - 07/15/21 1312     Visit Number 2    Number of Visits 9    Date for SLP Re-Evaluation 09/05/21    SLP Start Time 0938    SLP Stop Time  1019    SLP Time Calculation (min) 41 min    Activity Tolerance Patient tolerated treatment well             Past Medical History:  Diagnosis Date   Dyslipidemia 07/03/2021   Melanoma [facility])     Past Surgical History:  Procedure Laterality Date   MELANOMA EXCISION      There were no vitals filed for this visit.          ADULT SLP TREATMENT - 07/15/21 1221       General Information   Behavior/Cognition Cooperative;Distractible      Cognitive-Linquistic Treatment   Treatment focused on Cognition    Skilled Treatment SLP provided training on attention strategies and provided examples of each strategy. Pt and wife collaborated on how attention strategies could be implemented in iADLs. Pt and wife report premorbid difficulties with attention; however, attention deficits have exacerbated since stroke. Pt was instructed to select and practice two attention strategies for HEP.      Assessment / Recommendations / Plan   Plan Continue with current plan of care      Progression Toward Goals   Progression toward goals Progressing toward goals                SLP Short Term Goals - 07/15/21 1244       SLP SHORT TERM GOAL #1   Title Pt will recall 2-3 attention strategies that assist in reduction of cognitive fatigue across 3 sessions.    Time 4    Period Weeks    Status On-going    Target Date 08/03/21      SLP SHORT TERM GOAL #2   Title Pt will recall 2-3 memory  strategies that assist in recall of important information across 3 sessions.    Time 4    Period Weeks    Status On-going    Target Date 08/03/21              SLP Long Term Goals - 07/15/21 1246       SLP LONG TERM GOAL #1   Title Pt and wife will report implementation of 2-3 attention strategies to assist in reduction of cognitive fatigue.    Time 8    Period Weeks    Status On-going      SLP LONG TERM GOAL #2   Title Pt and wife will report implementation of 2-3 memory strategies to assist with recall of important information.    Time 8    Period Weeks    Status On-going              Plan - 07/15/21 1242     Clinical Impression Statement See tx note. Pt was receptive to attention strategies taught; however, had difficulty providing examples of impairments. Wife assisted with providing information regarding level of impairment. Cont. with current POC.    Speech Therapy Frequency 1x /week  Duration 8 weeks    Treatment/Interventions Compensatory strategies;Cueing hierarchy;Functional tasks;Patient/family education;Environmental controls;Cognitive reorganization;Compensatory techniques;Internal/external aids;SLP instruction and feedback    Potential to Achieve Goals Good    Consulted and Agree with Plan of Care Patient;Family member/caregiver    Family Member Consulted Wife, Stanton Kidney             Patient will benefit from skilled therapeutic intervention in order to improve the following deficits and impairments:   Cognitive communication deficit    Problem List Patient Active Problem List   Diagnosis Date Noted   Ischemic stroke ([place]) 07/03/2021   Dyslipidemia 07/03/2021   Melanoma of skin ([place]) 09/10/2016    Danise Mina B.S. Communication Sciences and Disorders  07/15/2021, 3:11 PM  [place]. [place], [place], [address] Phone: [phone]   Fax:  [phone]   Name: Martin Robinson MRN: [id number] Date of Birth: [birth year]

## 2021-07-20 ENCOUNTER — Telehealth: Payer: Self-pay | Admitting: Neurology

## 2021-07-20 NOTE — Telephone Encounter (Signed)
[facility] no auth require spoke to [facility] Ref # [id number]. Sent a message to Butch Penny, she will reach out to the patient to schedule.

## 2021-07-22 ENCOUNTER — Other Ambulatory Visit: Payer: Self-pay

## 2021-07-22 ENCOUNTER — Ambulatory Visit: Payer: Commercial Managed Care - PPO | Admitting: Speech Pathology

## 2021-07-22 ENCOUNTER — Encounter: Payer: Self-pay | Admitting: Speech Pathology

## 2021-07-22 DIAGNOSIS — R41841 Cognitive communication deficit: Secondary | ICD-10-CM | POA: Diagnosis not present

## 2021-07-22 NOTE — Therapy (Signed)
[place]. [place], [place], [address] Phone: [phone]   Fax:  [phone]  Speech Language Pathology Treatment  Patient Details  Name: Martin Robinson MRN: [id number] Date of Birth: 1961/04/10 Referring Provider (SLP): Lavina Hamman, MD   Encounter Date: 07/22/2021   End of Session - 07/22/21 1534     Visit Number 3    Number of Visits 9    Date for SLP Re-Evaluation 09/05/21    SLP Start Time 1315    SLP Stop Time  1400    SLP Time Calculation (min) 45 min    Activity Tolerance Patient tolerated treatment well             Past Medical History:  Diagnosis Date   Dyslipidemia 07/03/2021   Melanoma [facility])     Past Surgical History:  Procedure Laterality Date   MELANOMA EXCISION      There were no vitals filed for this visit.   Subjective Assessment - 07/22/21 1532     Subjective Pt and wife concerned with lack of motivation for exercise.    Currently in Pain? No/denies                   ADULT SLP TREATMENT - 07/22/21 0001       General Information   Behavior/Cognition Cooperative;Distractible      Pain Assessment   Pain Assessment No/denies pain      Cognitive-Linquistic Treatment   Treatment focused on Cognition    Skilled Treatment SLP trained pt on attention strategies. Pt was able to provide examples and demonstrate knowledge of each strategy. Pt required consistent redirection to remain on task. Pt and wife reported positive results from implementing attention strategies into iADLs (ex: scheduling 2 brain breaks each day). Wife reported difficulty with time management. SLP encouraged pt and wife to create a schedule as part of HEP to increase motivation and structure throughout the day. To dicuss communication strategies and memory next session.      Assessment / Recommendations / Plan   Plan Continue with current plan of care      Progression Toward Goals    Progression toward goals Progressing toward goals                SLP Short Term Goals - 07/15/21 1244       SLP SHORT TERM GOAL #1   Title Pt will recall 2-3 attention strategies that assist in reduction of cognitive fatigue across 3 sessions.    Time 4    Period Weeks    Status On-going    Target Date 08/03/21      SLP SHORT TERM GOAL #2   Title Pt will recall 2-3 memory strategies that assist in recall of important information across 3 sessions.    Time 4    Period Weeks    Status On-going    Target Date 08/03/21              SLP Long Term Goals - 07/15/21 1246       SLP LONG TERM GOAL #1   Title Pt and wife will report implementation of 2-3 attention strategies to assist in reduction of cognitive fatigue.    Time 8    Period Weeks    Status On-going      SLP LONG TERM GOAL #2   Title Pt and wife will report implementation of 2-3 memory strategies to assist with recall of important  information.    Time 8    Period Weeks    Status On-going              Plan - 07/22/21 1534     Clinical Impression Statement See tx note. Pt was receptive to attention strategies taught; however, continues to demonstrate difficulty with awareness of deficits. To address communication strategies and begin memory strategies if time next session. Cont. with current POC.    Speech Therapy Frequency 1x /week    Duration 8 weeks    Treatment/Interventions Compensatory strategies;Cueing hierarchy;Functional tasks;Patient/family education;Environmental controls;Cognitive reorganization;Compensatory techniques;Internal/external aids;SLP instruction and feedback    Potential to Achieve Goals Good    Consulted and Agree with Plan of Care Patient;Family member/caregiver    Family Member Consulted Wife, Stanton Kidney             Patient will benefit from skilled therapeutic intervention in order to improve the following deficits and impairments:   Cognitive communication  deficit    Problem List Patient Active Problem List   Diagnosis Date Noted   Ischemic stroke ([place]) 07/03/2021   Dyslipidemia 07/03/2021   Melanoma of skin ([place]) 09/10/2016    Danise Mina B.S. Communication Sciences and Disorders  07/22/2021, 3:47 PM  [place]. [place], [place], [address] Phone: [phone]   Fax:  [phone]   Name: Martin Robinson MRN: [id number] Date of Birth: [birth year]

## 2021-07-28 ENCOUNTER — Other Ambulatory Visit: Payer: Self-pay

## 2021-07-28 ENCOUNTER — Ambulatory Visit (HOSPITAL_COMMUNITY)
Admission: RE | Admit: 2021-07-28 | Discharge: 2021-07-28 | Disposition: A | Discharge status: Home / Self Care | Payer: Commercial Managed Care - PPO | Source: Ambulatory Visit | Attending: Neurology | Admitting: Neurology

## 2021-07-28 DIAGNOSIS — I6310 Cerebral infarction due to embolism of unspecified precerebral artery: Secondary | ICD-10-CM | POA: Insufficient documentation

## 2021-07-29 ENCOUNTER — Telehealth: Payer: Self-pay | Admitting: *Deleted

## 2021-07-29 ENCOUNTER — Ambulatory Visit: Payer: Commercial Managed Care - PPO | Admitting: Speech Pathology

## 2021-07-29 ENCOUNTER — Other Ambulatory Visit: Payer: Self-pay | Admitting: Neurology

## 2021-07-29 DIAGNOSIS — R41841 Cognitive communication deficit: Secondary | ICD-10-CM

## 2021-07-29 DIAGNOSIS — Q2112 Patent foramen ovale: Secondary | ICD-10-CM

## 2021-07-29 DIAGNOSIS — I6310 Cerebral infarction due to embolism of unspecified precerebral artery: Secondary | ICD-10-CM

## 2021-07-29 DIAGNOSIS — Q211 Atrial septal defect: Secondary | ICD-10-CM

## 2021-07-29 NOTE — Telephone Encounter (Addendum)
Spoke with patient. He is aware his TCD showed a very large PFO. Pt aware of recommend to have this closed as this was likely the cause of his stroke. Pt appreciates referral to Dr Burt Knack and will await a call from cardiology to schedule the consultation. His questions were answered.   ----- Message from Melvenia Beam, MD sent at 07/29/2021  2:30 PM EDT ----- The test shows a very large PFO (Patent Foramen Ovale) which is a large risk factor for stroke. I recommend having closed as this was likely the cause of your stroke. I am going to refer you to Dr. Sherren Mocha Cardiologist thanks.

## 2021-07-29 NOTE — Therapy (Signed)
[place] [place]. [place], [place], [address] Phone: [phone]   Fax:  [phone]  Speech Language Pathology Treatment  Patient Details  Name: Martin Robinson MRN: [id number] Date of Birth: [birth year] Referring Provider (SLP): Lavina Hamman, MD   Encounter Date: 07/29/2021   End of Session - 07/29/21 0949     Visit Number 4    Number of Visits 9    Date for SLP Re-Evaluation 09/05/21    SLP Start Time 0930    SLP Stop Time  1010    SLP Time Calculation (min) 40 min    Activity Tolerance Patient tolerated treatment well             Past Medical History:  Diagnosis Date   Dyslipidemia 07/03/2021   Melanoma [facility])     Past Surgical History:  Procedure Laterality Date   MELANOMA EXCISION      There were no vitals filed for this visit.   Subjective Assessment - 07/29/21 0948     Subjective Pt and wife report that they have been successful in using strategies at home and that pt has benefited.    Patient is accompained by: Family member    Currently in Pain? No/denies                   ADULT SLP TREATMENT - 07/29/21 1040       General Information   Behavior/Cognition Cooperative;Pleasant mood      Treatment Provided   Treatment provided Cognitive-Linquistic      Pain Assessment   Pain Assessment No/denies pain      Cognitive-Linquistic Treatment   Treatment focused on Cognition    Skilled Treatment SLP trained pt on internal and external memory strategies to assist pt with memory deficits. Pt was provided examples of each strategy and asked how he could implement in daily life. Pt and wife collaborated on how memory strategies could be implemented in [place]. SLP trained wife on caregiver communication strategies (reducing environment noise and having attention of pt before speaking) in order to assist with easier processing of information. Pt was instructed to select and practice two  memory strategies for HEP.      Assessment / Recommendations / Plan   Plan Continue with current plan of care      Progression Toward Goals   Progression toward goals Progressing toward goals                SLP Short Term Goals - 07/29/21 1309       SLP SHORT TERM GOAL #1   Title Pt will recall 2-3 attention strategies that assist in reduction of cognitive fatigue across 3 sessions.    Time 4    Period Weeks    Status Achieved    Target Date 08/03/21      SLP SHORT TERM GOAL #2   Title Pt will recall 2-3 memory strategies that assist in recall of important information across 3 sessions.    Time 4    Period Weeks    Status Achieved    Target Date 08/03/21              SLP Long Term Goals - 07/15/21 1246       SLP LONG TERM GOAL #1   Title Pt and wife will report implementation of 2-3 attention strategies to assist in reduction of cognitive fatigue.    Time 8    Period Weeks  Status On-going      SLP LONG TERM GOAL #2   Title Pt and wife will report implementation of 2-3 memory strategies to assist with recall of important information.    Time 8    Period Weeks    Status On-going              Plan - 07/29/21 1050     Clinical Impression Statement See tx note. SLP completed training on caregiver communication strategies to wife. Wife demonstrated how she could implement strategies in conversation with pt. SLP completed internal and external memory strategy training with pt. Pt provided examples of instances where he could utilize strategies. Cont. with current POC.    Speech Therapy Frequency 1x /week    Duration 8 weeks    Treatment/Interventions Compensatory strategies;Cueing hierarchy;Functional tasks;Patient/family education;Environmental controls;Cognitive reorganization;Compensatory techniques;Internal/external aids;SLP instruction and feedback    Potential to Achieve Goals Good    Consulted and Agree with Plan of Care Patient;Family  member/caregiver    Family Member Consulted Wife, Stanton Kidney             Patient will benefit from skilled therapeutic intervention in order to improve the following deficits and impairments:   Cognitive communication deficit    Problem List Patient Active Problem List   Diagnosis Date Noted   Ischemic stroke ([place]) 07/03/2021   Dyslipidemia 07/03/2021   Melanoma of skin ([place]) 09/10/2016    Martin Mina B.S. Communication Sciences and Disorders  07/29/2021, 1:10 PM  [place]. [place], [place], [address] Phone: [phone]   Fax:  [phone]   Name: Martin Robinson MRN: [id number] Date of Birth: 1960/12/14

## 2021-07-31 ENCOUNTER — Telehealth: Payer: Self-pay

## 2021-07-31 NOTE — Telephone Encounter (Signed)
-----   Message from Sherren Mocha, MD sent at 07/31/2021  8:27 AM EDT ----- Regarding: RE: another pfo in young patient: Spencer Grade V patent foramen ovale (PFO). Yes of course. I'm copying my nurse, Lenice Llamas, who will help facilitate the consult. thx ----- Message ----- From: Melvenia Beam, MD Sent: 07/28/2021   2:18 PM EDT To: Sherren Mocha, MD Subject: another pfo in young patient: Spencer Grade #  Hi Dr. Burt Knack, this  is the TCD report for a young patient with a stroke. Can I send him over to you?    Summary:    A vascular evaluation was performed. The right middle cerebral artery was studied. An IV was inserted into the patient's left forearm. Verbal informed consent was obtained.   A full curtain of high intensity transient signals (HITS) was observed at rest and with valsalva, indicating a Spencer Grade V patent foramen ovale (PFO).   #See table(s) above for TCD measurements and observations

## 2021-07-31 NOTE — Telephone Encounter (Signed)
Per Dr. Jaynee Eagles, called to arrange PFO closure with Dr. Burt Knack.  Left message to call back.

## 2021-08-03 NOTE — Telephone Encounter (Signed)
Left message to call back  

## 2021-08-05 ENCOUNTER — Ambulatory Visit: Payer: Commercial Managed Care - PPO | Admitting: Speech Pathology

## 2021-08-05 ENCOUNTER — Other Ambulatory Visit: Payer: Self-pay

## 2021-08-05 ENCOUNTER — Encounter: Payer: Self-pay | Admitting: Speech Pathology

## 2021-08-05 DIAGNOSIS — R41841 Cognitive communication deficit: Secondary | ICD-10-CM | POA: Diagnosis not present

## 2021-08-05 NOTE — Telephone Encounter (Signed)
Per Dr. Jaynee Eagles, scheduled the patient for PFO consult with Dr. Burt Knack 08/10/2021. He was grateful for call and agrees with plan.

## 2021-08-05 NOTE — Therapy (Addendum)
[facility]. [place], [place], [address] Phone: [phone]   Fax:  [phone]  Speech Language Pathology Treatment & Discharge  Patient Details  Name: Martin Robinson MRN: [id number] Date of Birth: [birth year] Referring Provider (SLP): Lavina Hamman, MD   Encounter Date: 08/05/2021   End of Session - 08/05/21 0939     Visit Number 5    Number of Visits 9    Date for SLP Re-Evaluation 09/05/21    SLP Start Time 0930    SLP Stop Time  8871    SLP Time Calculation (min) 45 min    Activity Tolerance Patient tolerated treatment well             Past Medical History:  Diagnosis Date   Dyslipidemia 07/03/2021   Melanoma [facility])     Past Surgical History:  Procedure Laterality Date   MELANOMA EXCISION      There were no vitals filed for this visit.   Subjective Assessment - 08/05/21 0932     Subjective Pt reports that he is feeling "more himself" today.    Currently in Pain? No/denies                   ADULT SLP TREATMENT - 08/05/21 1007       General Information   Behavior/Cognition Pleasant mood;Cooperative;Alert      Cognitive-Linquistic Treatment   Treatment focused on Cognition    Skilled Treatment Pt and wife report success with utilizing memory and attention strategies in his home and work environments. Pt provided examples of using brain breaks at work. Pt feels "back to normal". SLP trained pt on time management strategies in order to assist with managing tasks at work. Pt provided examples of how to implement strategies at work. SLP discussed discharge with pt and wife. Pt and wife in agreement for d/c.      Assessment / Recommendations / Plan   Plan Continue with current plan of care      Progression Toward Goals   Progression toward goals Progressing toward goals                SLP Short Term Goals - 08/05/21 0942       SLP SHORT TERM GOAL #1   Title Pt will  recall 2-3 attention strategies that assist in reduction of cognitive fatigue across 3 sessions.    Time 4    Period Weeks    Status Achieved    Target Date 08/03/21      SLP SHORT TERM GOAL #2   Title Pt will recall 2-3 memory strategies that assist in recall of important information across 3 sessions.    Time 4    Period Weeks    Status Achieved    Target Date 08/03/21              SLP Long Term Goals - 08/05/21 0941       SLP LONG TERM GOAL #1   Title Pt and wife will report implementation of 2-3 attention strategies to assist in reduction of cognitive fatigue.    Time 8    Period Weeks    Status Achieved      SLP LONG TERM GOAL #2   Title Pt and wife will report implementation of 2-3 memory strategies to assist with recall of important information.    Time 8    Period Weeks    Status Achieved  Plan - 08/05/21 1128     Clinical Impression Statement See tx note. SLP completed training on time management strategies. Pt demonstrated an understanding of strategies and provided examples. SLP discussed d/c with pt and wife. Pt and wife were in agreement for d/c. Cont. with current POC.    Speech Therapy Frequency 1x /week    Duration 8 weeks    Treatment/Interventions Compensatory strategies;Cueing hierarchy;Functional tasks;Patient/family education;Environmental controls;Cognitive reorganization;Compensatory techniques;Internal/external aids;SLP instruction and feedback    Potential to Achieve Goals Good    Consulted and Agree with Plan of Care Patient;Family member/caregiver    Family Member Consulted Wife, Stanton Kidney             Patient will benefit from skilled therapeutic intervention in order to improve the following deficits and impairments:   Cognitive communication deficit    Problem List Patient Active Problem List   Diagnosis Date Noted   Ischemic stroke ([place]) 07/03/2021   Dyslipidemia 07/03/2021   Melanoma of skin ([place]) 09/10/2016    SPEECH THERAPY DISCHARGE SUMMARY  Visits from Start of Care: 5  Current functional level related to goals / functional outcomes: See previous tx notes. Pt has met all goals and reports feeling back to baseline.    Remaining deficits: Pt continues to exhibit attention deficits; however, pt hx of ADHD at baseline.    Education / Equipment: Pt was provided with memory, attention, and time management strategies to utilize during work and daily activities.   Patient agrees to discharge. Patient goals were met. Patient is being discharged due to meeting the stated rehab goals.     [facility]. [facility]  08/05/2021, 11:32 AM  [facility]. [place], [place], [address] Phone: [phone]   Fax:  [phone]   Name: Martin Robinson MRN: [id number] Date of Birth: 07/03/1961

## 2021-08-10 ENCOUNTER — Encounter: Payer: Self-pay | Admitting: Cardiovascular Disease

## 2021-08-10 ENCOUNTER — Encounter: Payer: Self-pay | Admitting: Neurology

## 2021-08-10 ENCOUNTER — Other Ambulatory Visit: Payer: Self-pay

## 2021-08-10 ENCOUNTER — Ambulatory Visit (INDEPENDENT_AMBULATORY_CARE_PROVIDER_SITE_OTHER): Payer: Commercial Managed Care - PPO | Admitting: Neurology

## 2021-08-10 ENCOUNTER — Ambulatory Visit (INDEPENDENT_AMBULATORY_CARE_PROVIDER_SITE_OTHER): Payer: Commercial Managed Care - PPO | Admitting: Cardiovascular Disease

## 2021-08-10 VITALS — BP 126/79 | HR 78 | Ht 67.0 in | Wt 197.0 lb

## 2021-08-10 VITALS — BP 118/80 | HR 71 | Wt 197.6 lb

## 2021-08-10 DIAGNOSIS — Z01810 Encounter for preprocedural cardiovascular examination: Secondary | ICD-10-CM | POA: Diagnosis not present

## 2021-08-10 DIAGNOSIS — Q2112 Patent foramen ovale: Secondary | ICD-10-CM

## 2021-08-10 DIAGNOSIS — I639 Cerebral infarction, unspecified: Secondary | ICD-10-CM

## 2021-08-10 DIAGNOSIS — Z01818 Encounter for other preprocedural examination: Secondary | ICD-10-CM | POA: Diagnosis not present

## 2021-08-10 LAB — BASIC METABOLIC PANEL WITH GFR
BUN/Creatinine Ratio: 15 (ref 9–20)
BUN: 14 mg/dL (ref 6–24)
CO2: 29 mmol/L (ref 20–29)
Calcium: 9.7 mg/dL (ref 8.7–10.2)
Chloride: 102 mmol/L (ref 96–106)
Creatinine, Ser: 0.96 mg/dL (ref 0.76–1.27)
Glucose: 91 mg/dL (ref 70–99)
Potassium: 4.5 mmol/L (ref 3.5–5.2)
Sodium: 138 mmol/L (ref 134–144)
eGFR: 92 mL/min/{1.73_m2}

## 2021-08-10 LAB — CBC
Hematocrit: 43.9 % (ref 37.5–51.0)
Hemoglobin: 15 g/dL (ref 13.0–17.7)
MCH: 30.7 pg (ref 26.6–33.0)
MCHC: 34.2 g/dL (ref 31.5–35.7)
MCV: 90 fL (ref 79–97)
Platelets: 268 10*3/uL (ref 150–450)
RBC: 4.89 x10E6/uL (ref 4.14–5.80)
RDW: 13.8 % (ref 11.6–15.4)
WBC: 5.9 10*3/uL (ref 3.4–10.8)

## 2021-08-10 MED ORDER — CLOPIDOGREL BISULFATE 75 MG PO TABS: 75.0000 mg | ORAL_TABLET | Freq: Every day | ORAL | 3 refills | Status: DC

## 2021-08-10 NOTE — Progress Notes (Signed)
Attempted to obtain medical history via telephone, unable to reach at this time. I left a voicemail to return pre surgical testing department's phone call.  

## 2021-08-10 NOTE — Progress Notes (Signed)
Cardiology Office Note:    Date:  08/10/2021   ID:  Martin Robinson, DOB 05-24-1961, MRN [id number]  PCP:  Martin Blase, MD   [facility] Providers Cardiologist:  None     Referring MD: Martin Blase, MD   Chief Complaint  Patient presents with   pfo     History of Present Illness:    Martin Robinson is a 60 y.o. male presenting for evaluation of PFO.  The patient was recently hospitalized July 03, 2021 with transient dysarthria, left facial droop, incoordination, and leftward leaning gait.  The patient was out of the country in [place] when his symptoms initially occurred.  The patient's wife is an emergency room nurse and immediately recognizes his symptoms of raising concern for stroke.  He was taken to an emergency room and he underwent a head CT that was reportedly negative.  However, neurology review of the films revealed a right thalamic hypodensity suggestive of acute stroke.  They came home urgently to seek further evaluation.  A CT angio of the head and neck showed a right thalamic lacunar infarct with no associated hemorrhage or mass-effect.  An MRI confirmed an acute right thalamic infarct.  An echocardiogram showed normal LV function, mild LVH, normal diastolic parameters, normal RV function, and no significant valvular disease.  The patient was initiated on dual antiplatelet therapy with aspirin and clopidogrel.  Following his hospitalization, some outpatient testing was performed.  A transcranial Doppler study showed a full curtain effect at rest and with Valsalva, indicating a Spencer grade 5 right to left shunt suggestive of large PFO.  The patient is referred for further evaluation.  An outpatient cardiac monitor was performed which showed rare PACs and PVCs, both occurring less than 1%.  There were 15 episodes of nonsustained atrial tachycardia but no evidence of atrial fibrillation.  There were no sustained arrhythmias.  Hypercoagulable work-up was completed and  this was negative for lupus anticoagulant, protein C&S deficiency, factor V Leiden, and Antithrombin activity.  Beta-2 glycoprotein's were negative.  The patient is here with his wife today. He has had fatigue ever since having Covid last year. However, since his stroke, he has had increased fatigue and forgetfulness. He's had some speech problems only when he is tired. He otherwise hasn't had specific long-term sequelae such as unilateral weakness or clumsiness. He has been able to play golf and he's walking on the golf course. The patient has previously been a marathon runner and a very active gentleman in the past.   Past Medical History:  Diagnosis Date   Dyslipidemia 07/03/2021   Melanoma [facility])     Past Surgical History:  Procedure Laterality Date   MELANOMA EXCISION      Current Medications: Current Meds  Medication Sig   aspirin EC 81 MG EC tablet Take 1 tablet (81 mg total) by mouth daily. Swallow whole.   atorvastatin (LIPITOR) 40 MG tablet Take 1 tablet (40 mg total) by mouth daily.   clopidogrel (PLAVIX) 75 MG tablet Take 1 tablet (75 mg total) by mouth daily.   Inositol Niacinate (NIACIN FLUSH FREE PO) Take 500 mg by mouth daily.   Multiple Vitamins-Minerals (MULTI FOR HIM 50+) TABS Take 1 tablet by mouth daily.   Omega-3 Fatty Acids (FISH OIL) 1000 MG CAPS Take 1,000 mg by mouth daily.   OVER THE COUNTER MEDICATION Take 6 tablets by mouth daily. 3 fruit & 3 veggie complete balance vitamins     Allergies:   Patient has  no known allergies.   Social History   Socioeconomic History   Marital status: Married    Spouse name: Not on file   Number of children: Not on file   Years of education: Not on file   Highest education level: Not on file  Occupational History   Occupation: insurance  Tobacco Use   Smoking status: Former    Types: Cigarettes, Cigars   Smokeless tobacco: Former   Tobacco comments:    Cigars, nicotine packets  Substance and Sexual Activity    Alcohol use: Yes    Alcohol/week: 10.0 standard drinks    Types: 10 Standard drinks or equivalent per week    Comment: " bunch"   Drug use: Never   Sexual activity: Never  Other Topics Concern   Not on file  Social History Narrative   Not on file   Social Determinants of Health   Financial Resource Strain: Not on file  Food Insecurity: Not on file  Transportation Needs: Not on file  Physical Activity: Not on file  Stress: Not on file  Social Connections: Not on file     Family History: The patient's family history includes CAD in his father and paternal uncle; Cancer in his father and mother; Diabetes in his father; Stroke in his paternal uncle.  ROS:   Please see the history of present illness.    All other systems reviewed and are negative.  EKGs/Labs/Other Studies Reviewed:    The following studies were reviewed today: Echo: IMPRESSIONS     1. Left ventricular ejection fraction, by estimation, is 60 to 65%. The  left ventricle has normal function. The left ventricle has no regional  wall motion abnormalities. There is mild left ventricular hypertrophy.  Left ventricular diastolic parameters  were normal.   2. Right ventricular systolic function is normal. The right ventricular  size is normal. Tricuspid regurgitation signal is inadequate for assessing  PA pressure.   3. The mitral valve is normal in structure. Trivial mitral valve  regurgitation. No evidence of mitral stenosis.   4. The aortic valve is tricuspid. Aortic valve regurgitation is not  visualized. No aortic stenosis is present.   5. The inferior vena cava is normal in size with greater than 50%  respiratory variability, suggesting right atrial pressure of 3 mmHg.   Conclusion(s)/Recommendation(s): No intracardiac source of embolism  detected on this transthoracic study. A transesophageal echocardiogram is  recommended to exclude cardiac source of embolism if clinically indicated.   Event  monitor: Sinus rhythm Patient had a min HR of 55 bpm, max HR of 240 bpm, and avg HR of 89 bpm. Rare premature atrial contractions and premature ventricular contractions (<1%) 15 episodes of nonsustained atrial tachycardia with longest lasting 20 beats 1 run of Ventricular Tachycardia occurred lasting 4 beats with a max rate of 240 bpm (avg 194 bpm). No atrial fibrillation No sustained arrhythmias  CTA Head/Neck: IMPRESSION: 1. Positive for a Right Thalamic lacunar infarct. No associated hemorrhage or mass effect. Although age indeterminate by CT, this is most likely the symptomatic abnormality as CTA further demonstrates a partially occluded Right PCA P1 segment (non-dominant due to fetal PCA origins) correlating with Right Thalamostriate Artery ischemia.   2. But otherwise normal arterial findings in the head and neck. No atherosclerosis or atherosclerotic stenosis identified.   3. And otherwise negative CT appearance of the brain.  EKG:  EKG is ordered today.  The ekg ordered today demonstrates NSR 71 bpm, within normal limits  Recent  Labs: 07/03/2021: ALT 17; BUN 15; Creatinine, Ser 1.09; Hemoglobin 13.6; Platelets 236; Potassium 3.5; Sodium 136  Recent Lipid Panel    Component Value Date/Time   CHOL 179 07/04/2021 0145   TRIG 80 07/04/2021 0145   HDL 48 07/04/2021 0145   CHOLHDL 3.7 07/04/2021 0145   VLDL 16 07/04/2021 0145   LDLCALC 115 (H) 07/04/2021 0145     Risk Assessment/Calculations:           Physical Exam:    VS:  BP 118/80 (BP Location: Left Arm, Patient Position: Sitting, Cuff Size: Normal)   Pulse 71   Wt 197 lb 9.6 oz (89.6 kg)   BMI 22.26 kg/m     Wt Readings from Last 3 Encounters:  08/10/21 197 lb 9.6 oz (89.6 kg)  07/03/21 185 lb (83.9 kg)  09/10/16 188 lb 1.6 oz (85.3 kg)     GEN:  Well nourished, well developed in no acute distress HEENT: Normal NECK: No JVD; No carotid bruits LYMPHATICS: No lymphadenopathy CARDIAC: RRR, no murmurs,  rubs, gallops RESPIRATORY:  Clear to auscultation without rales, wheezing or rhonchi  ABDOMEN: Soft, non-tender, non-distended MUSCULOSKELETAL:  No edema; No deformity  SKIN: Warm and dry NEUROLOGIC:  Alert and oriented x 3 PSYCHIATRIC:  Normal affect   ASSESSMENT:    1. PFO (patent foramen ovale)   2. 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  All available data is reviewed from his recent outpatient evaluation.  This demonstrates radiographic evidence of a right thalamic infarct.  There is no significant atherosclerotic disease in the head and neck vessels based on CT angiography.  The patient's hypercoagulability work-up is negative.  He has no history of hypertension, diabetes, or hyperlipidemia.  Transcranial Doppler study shows a curtain effect at rest on Valsalva, consistent with a large intracardiac right to left shunt.  I reviewed clinical data regarding the association of PFO and cryptogenic stroke with the patient and his wife today.  We discussed available clinical trial data evaluating long-term risk reduction with PFO closure and contrast with medical therapy.  I demonstrated the Amplatzer PFO occluder device with the patient and reviewed the procedural steps.  We discussed specific risks of the procedure including but not limited to vascular injury, bleeding, stroke, myocardial infarction, cardiac injury, arrhythmia, device embolization, device erosion, device infection, device thrombus, emergency cardiac surgery, and death.  The patient understands that any of these serious complications occur at a low risk of less than 1%.  In order to better define the presence and anatomic significance of his PFO, the patient will require a transesophageal echo study.  We again reviewed the risks, indications, and alternatives to TEE.  See below for specific risk discussion.  Once  his TEE is completed, he will be scheduled for transcatheter PFO closure as long as this is anatomically appropriate to do.  The patient and his wife have a cruise coming up in a few weeks to celebrate their 30th anniversary.  They ask about whether it is okay to take this trip.  I think as long as he is on appropriate medical therapy, he should be appropriately low risk.  I would probably go ahead and start him back on clopidogrel in anticipation of PFO closure.  This may give him some added protection during his travel.  We will also make sure that his TEE is completed prior to leaving on  the trip so that there is a treatment plan in place before he goes.   Shared Decision Making/Informed Consent The risks [esophageal damage, perforation (1:10,000 risk), bleeding, pharyngeal hematoma as well as other potential complications associated with conscious sedation including aspiration, arrhythmia, respiratory failure and death], benefits (treatment guidance and diagnostic support) and alternatives of a transesophageal echocardiogram were discussed in detail with Mr. Lasser and he is willing to proceed.     Medication Adjustments/Labs and Tests Ordered: Current medicines are reviewed at length with the patient today.  Concerns regarding medicines are outlined above.  Orders Placed This Encounter  Procedures   Basic Metabolic Panel (BMET)   CBC   EKG 12-Lead   Meds ordered this encounter  Medications   clopidogrel (PLAVIX) 75 MG tablet    Sig: Take 1 tablet (75 mg total) by mouth daily.    Dispense:  90 tablet    Refill:  3    Patient Instructions  Medication Instructions:  Start Plavix 75 mg daily  *If you need a refill on your cardiac medications before your next appointment, please call your pharmacy*   Lab Work: BMET and CBC today for your TEE   If you have labs (blood work) drawn today and your tests are completely normal, you will receive your results only by: [place] (if  you have MyChart) OR A paper copy in the mail If you have any lab test that is abnormal or we need to change your treatment, we will call you to review the results.   Testing/Procedures:   Your physician has requested that you have a TEE. During a TEE, sound waves are used to create images of your heart. It provides your doctor with information about the size and shape of your heart and how well your heart's chambers and valves are working. In this test, a transducer is attached to the end of a flexible tube that's guided down your throat and into your esophagus (the tube leading from you mouth to your stomach) to get a more detailed image of your heart. You are not awake for the procedure. Please see the instruction sheet given to you today. For further information please visit [email or web address].    Follow-Up: At [facility], you and your health needs are our priority.  As part of our continuing mission to provide you with exceptional heart care, we have created designated Provider Care Teams.  These Care Teams include your primary Cardiologist (physician) and Advanced Practice Providers (APPs -  Physician Assistants and Nurse Practitioners) who all work together to provide you with the care you need, when you need it.  We recommend signing up for the patient portal called "MyChart".  Sign up information is provided on this After Visit Summary.  MyChart is used to connect with patients for Virtual Visits (Telemedicine).  Patients are able to view lab/test results, encounter notes, upcoming appointments, etc.  Non-urgent messages can be sent to your provider as well.   To learn more about what you can do with MyChart, go to [email or web address].     Signed, Sherren Mocha, MD  08/10/2021 10:21 AM    [place]

## 2021-08-10 NOTE — Patient Instructions (Signed)
Medication Instructions:  Start Plavix 75 mg daily  *If you need a refill on your cardiac medications before your next appointment, please call your pharmacy*   Lab Work: BMET and CBC today for your TEE   If you have labs (blood work) drawn today and your tests are completely normal, you will receive your results only by: Martin Robinson (if you have MyChart) OR A paper copy in the mail If you have any lab test that is abnormal or we need to change your treatment, we will call you to review the results.   Testing/Procedures:   Your physician has requested that you have a TEE. During a TEE, sound waves are used to create images of your heart. It provides your doctor with information about the size and shape of your heart and how well your heart's chambers and valves are working. In this test, a transducer is attached to the end of a flexible tube that's guided down your throat and into your esophagus (the tube leading from you mouth to your stomach) to get a more detailed image of your heart. You are not awake for the procedure. Please see the instruction sheet given to you today. For further information please visit [email or web address].    Follow-Up: At [facility], you and your health needs are our priority.  As part of our continuing mission to provide you with exceptional heart care, we have created designated Provider Care Teams.  These Care Teams include your primary Cardiologist (physician) and Advanced Practice Providers (APPs -  Physician Assistants and Nurse Practitioners) who all work together to provide you with the care you need, when you need it.  We recommend signing up for the patient portal called "MyChart".  Sign up information is provided on this After Visit Summary.  MyChart is used to connect with patients for Virtual Visits (Telemedicine).  Patients are able to view lab/test results, encounter notes, upcoming appointments, etc.  Non-urgent messages can be sent to your  provider as well.   To learn more about what you can do with MyChart, go to [email or web address].

## 2021-08-10 NOTE — H&P (View-Only) (Signed)
Cardiology Office Note:    Date:  08/10/2021   ID:  Martin Robinson, DOB Nov 20, 1960, MRN [id number]  PCP:  Eunice Blase, MD   [facility] Providers Cardiologist:  None     Referring MD: Eunice Blase, MD   Chief Complaint  Patient presents with   pfo     History of Present Illness:    Martin Robinson is a 60 y.o. male presenting for evaluation of PFO.  The patient was recently hospitalized July 03, 2021 with transient dysarthria, left facial droop, incoordination, and leftward leaning gait.  The patient was out of the country in [place] when his symptoms initially occurred.  The patient's wife is an emergency room nurse and immediately recognizes his symptoms of raising concern for stroke.  He was taken to an emergency room and he underwent a head CT that was reportedly negative.  However, neurology review of the films revealed a right thalamic hypodensity suggestive of acute stroke.  They came home urgently to seek further evaluation.  A CT angio of the head and neck showed a right thalamic lacunar infarct with no associated hemorrhage or mass-effect.  An MRI confirmed an acute right thalamic infarct.  An echocardiogram showed normal LV function, mild LVH, normal diastolic parameters, normal RV function, and no significant valvular disease.  The patient was initiated on dual antiplatelet therapy with aspirin and clopidogrel.  Following his hospitalization, some outpatient testing was performed.  A transcranial Doppler study showed a full curtain effect at rest and with Valsalva, indicating a Spencer grade 5 right to left shunt suggestive of large PFO.  The patient is referred for further evaluation.  An outpatient cardiac monitor was performed which showed rare PACs and PVCs, both occurring less than 1%.  There were 15 episodes of nonsustained atrial tachycardia but no evidence of atrial fibrillation.  There were no sustained arrhythmias.  Hypercoagulable work-up was completed and  this was negative for lupus anticoagulant, protein C&S deficiency, factor V Leiden, and Antithrombin activity.  Beta-2 glycoprotein's were negative.  The patient is here with his wife today. He has had fatigue ever since having Covid last year. However, since his stroke, he has had increased fatigue and forgetfulness. He's had some speech problems only when he is tired. He otherwise hasn't had specific long-term sequelae such as unilateral weakness or clumsiness. He has been able to play golf and he's walking on the golf course. The patient has previously been a marathon runner and a very active gentleman in the past.   Past Medical History:  Diagnosis Date   Dyslipidemia 07/03/2021   Melanoma [facility])     Past Surgical History:  Procedure Laterality Date   MELANOMA EXCISION      Current Medications: Current Meds  Medication Sig   aspirin EC 81 MG EC tablet Take 1 tablet (81 mg total) by mouth daily. Swallow whole.   atorvastatin (LIPITOR) 40 MG tablet Take 1 tablet (40 mg total) by mouth daily.   clopidogrel (PLAVIX) 75 MG tablet Take 1 tablet (75 mg total) by mouth daily.   Inositol Niacinate (NIACIN FLUSH FREE PO) Take 500 mg by mouth daily.   Multiple Vitamins-Minerals (MULTI FOR HIM 50+) TABS Take 1 tablet by mouth daily.   Omega-3 Fatty Acids (FISH OIL) 1000 MG CAPS Take 1,000 mg by mouth daily.   OVER THE COUNTER MEDICATION Take 6 tablets by mouth daily. 3 fruit & 3 veggie complete balance vitamins     Allergies:   Patient has  no known allergies.   Social History   Socioeconomic History   Marital status: Married    Spouse name: Not on file   Number of children: Not on file   Years of education: Not on file   Highest education level: Not on file  Occupational History   Occupation: insurance  Tobacco Use   Smoking status: Former    Types: Cigarettes, Cigars   Smokeless tobacco: Former   Tobacco comments:    Cigars, nicotine packets  Substance and Sexual Activity    Alcohol use: Yes    Alcohol/week: 10.0 standard drinks    Types: 10 Standard drinks or equivalent per week    Comment: " bunch"   Drug use: Never   Sexual activity: Never  Other Topics Concern   Not on file  Social History Narrative   Not on file   Social Determinants of Health   Financial Resource Strain: Not on file  Food Insecurity: Not on file  Transportation Needs: Not on file  Physical Activity: Not on file  Stress: Not on file  Social Connections: Not on file     Family History: The patient's family history includes CAD in his father and paternal uncle; Cancer in his father and mother; Diabetes in his father; Stroke in his paternal uncle.  ROS:   Please see the history of present illness.    All other systems reviewed and are negative.  EKGs/Labs/Other Studies Reviewed:    The following studies were reviewed today: Echo: IMPRESSIONS     1. Left ventricular ejection fraction, by estimation, is 60 to 65%. The  left ventricle has normal function. The left ventricle has no regional  wall motion abnormalities. There is mild left ventricular hypertrophy.  Left ventricular diastolic parameters  were normal.   2. Right ventricular systolic function is normal. The right ventricular  size is normal. Tricuspid regurgitation signal is inadequate for assessing  PA pressure.   3. The mitral valve is normal in structure. Trivial mitral valve  regurgitation. No evidence of mitral stenosis.   4. The aortic valve is tricuspid. Aortic valve regurgitation is not  visualized. No aortic stenosis is present.   5. The inferior vena cava is normal in size with greater than 50%  respiratory variability, suggesting right atrial pressure of 3 mmHg.   Conclusion(s)/Recommendation(s): No intracardiac source of embolism  detected on this transthoracic study. A transesophageal echocardiogram is  recommended to exclude cardiac source of embolism if clinically indicated.   Event  monitor: Sinus rhythm Patient had a min HR of 55 bpm, max HR of 240 bpm, and avg HR of 89 bpm. Rare premature atrial contractions and premature ventricular contractions (<1%) 15 episodes of nonsustained atrial tachycardia with longest lasting 20 beats 1 run of Ventricular Tachycardia occurred lasting 4 beats with a max rate of 240 bpm (avg 194 bpm). No atrial fibrillation No sustained arrhythmias  CTA Head/Neck: IMPRESSION: 1. Positive for a Right Thalamic lacunar infarct. No associated hemorrhage or mass effect. Although age indeterminate by CT, this is most likely the symptomatic abnormality as CTA further demonstrates a partially occluded Right PCA P1 segment (non-dominant due to fetal PCA origins) correlating with Right Thalamostriate Artery ischemia.   2. But otherwise normal arterial findings in the head and neck. No atherosclerosis or atherosclerotic stenosis identified.   3. And otherwise negative CT appearance of the brain.  EKG:  EKG is ordered today.  The ekg ordered today demonstrates NSR 71 bpm, within normal limits  Recent  Labs: 07/03/2021: ALT 17; BUN 15; Creatinine, Ser 1.09; Hemoglobin 13.6; Platelets 236; Potassium 3.5; Sodium 136  Recent Lipid Panel    Component Value Date/Time   CHOL 179 07/04/2021 0145   TRIG 80 07/04/2021 0145   HDL 48 07/04/2021 0145   CHOLHDL 3.7 07/04/2021 0145   VLDL 16 07/04/2021 0145   LDLCALC 115 (H) 07/04/2021 0145     Risk Assessment/Calculations:           Physical Exam:    VS:  BP 118/80 (BP Location: Left Arm, Patient Position: Sitting, Cuff Size: Normal)   Pulse 71   Wt 197 lb 9.6 oz (89.6 kg)   BMI 22.26 kg/m     Wt Readings from Last 3 Encounters:  08/10/21 197 lb 9.6 oz (89.6 kg)  07/03/21 185 lb (83.9 kg)  09/10/16 188 lb 1.6 oz (85.3 kg)     GEN:  Well nourished, well developed in no acute distress HEENT: Normal NECK: No JVD; No carotid bruits LYMPHATICS: No lymphadenopathy CARDIAC: RRR, no murmurs,  rubs, gallops RESPIRATORY:  Clear to auscultation without rales, wheezing or rhonchi  ABDOMEN: Soft, non-tender, non-distended MUSCULOSKELETAL:  No edema; No deformity  SKIN: Warm and dry NEUROLOGIC:  Alert and oriented x 3 PSYCHIATRIC:  Normal affect   ASSESSMENT:    1. PFO (patent foramen ovale)   2. 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  All available data is reviewed from his recent outpatient evaluation.  This demonstrates radiographic evidence of a right thalamic infarct.  There is no significant atherosclerotic disease in the head and neck vessels based on CT angiography.  The patient's hypercoagulability work-up is negative.  He has no history of hypertension, diabetes, or hyperlipidemia.  Transcranial Doppler study shows a curtain effect at rest on Valsalva, consistent with a large intracardiac right to left shunt.  I reviewed clinical data regarding the association of PFO and cryptogenic stroke with the patient and his wife today.  We discussed available clinical trial data evaluating long-term risk reduction with PFO closure and contrast with medical therapy.  I demonstrated the Amplatzer PFO occluder device with the patient and reviewed the procedural steps.  We discussed specific risks of the procedure including but not limited to vascular injury, bleeding, stroke, myocardial infarction, cardiac injury, arrhythmia, device embolization, device erosion, device infection, device thrombus, emergency cardiac surgery, and death.  The patient understands that any of these serious complications occur at a low risk of less than 1%.  In order to better define the presence and anatomic significance of his PFO, the patient will require a transesophageal echo study.  We again reviewed the risks, indications, and alternatives to TEE.  See below for specific risk discussion.  Once  his TEE is completed, he will be scheduled for transcatheter PFO closure as long as this is anatomically appropriate to do.  The patient and his wife have a cruise coming up in a few weeks to celebrate their 30th anniversary.  They ask about whether it is okay to take this trip.  I think as long as he is on appropriate medical therapy, he should be appropriately low risk.  I would probably go ahead and start him back on clopidogrel in anticipation of PFO closure.  This may give him some added protection during his travel.  We will also make sure that his TEE is completed prior to leaving on  the trip so that there is a treatment plan in place before he goes.   Shared Decision Making/Informed Consent The risks [esophageal damage, perforation (1:10,000 risk), bleeding, pharyngeal hematoma as well as other potential complications associated with conscious sedation including aspiration, arrhythmia, respiratory failure and death], benefits (treatment guidance and diagnostic support) and alternatives of a transesophageal echocardiogram were discussed in detail with Mr. Greth and he is willing to proceed.     Medication Adjustments/Labs and Tests Ordered: Current medicines are reviewed at length with the patient today.  Concerns regarding medicines are outlined above.  Orders Placed This Encounter  Procedures   Basic Metabolic Panel (BMET)   CBC   EKG 12-Lead   Meds ordered this encounter  Medications   clopidogrel (PLAVIX) 75 MG tablet    Sig: Take 1 tablet (75 mg total) by mouth daily.    Dispense:  90 tablet    Refill:  3    Patient Instructions  Medication Instructions:  Start Plavix 75 mg daily  *If you need a refill on your cardiac medications before your next appointment, please call your pharmacy*   Lab Work: BMET and CBC today for your TEE   If you have labs (blood work) drawn today and your tests are completely normal, you will receive your results only by: [place] (if  you have MyChart) OR A paper copy in the mail If you have any lab test that is abnormal or we need to change your treatment, we will call you to review the results.   Testing/Procedures:   Your physician has requested that you have a TEE. During a TEE, sound waves are used to create images of your heart. It provides your doctor with information about the size and shape of your heart and how well your heart's chambers and valves are working. In this test, a transducer is attached to the end of a flexible tube that's guided down your throat and into your esophagus (the tube leading from you mouth to your stomach) to get a more detailed image of your heart. You are not awake for the procedure. Please see the instruction sheet given to you today. For further information please visit [email or web address].    Follow-Up: At [facility], you and your health needs are our priority.  As part of our continuing mission to provide you with exceptional heart care, we have created designated Provider Care Teams.  These Care Teams include your primary Cardiologist (physician) and Advanced Practice Providers (APPs -  Physician Assistants and Nurse Practitioners) who all work together to provide you with the care you need, when you need it.  We recommend signing up for the patient portal called "MyChart".  Sign up information is provided on this After Visit Summary.  MyChart is used to connect with patients for Virtual Visits (Telemedicine).  Patients are able to view lab/test results, encounter notes, upcoming appointments, etc.  Non-urgent messages can be sent to your provider as well.   To learn more about what you can do with MyChart, go to [email or web address].     Signed, Sherren Mocha, MD  08/10/2021 10:21 AM    [place]

## 2021-08-10 NOTE — H&P (View-Only) (Signed)
Cardiology Office Note:    Date:  08/10/2021   ID:  Martin Robinson, DOB Apr 17, 1961, MRN [id number]  PCP:  Martin Blase, MD   [facility] Providers Cardiologist:  None     Referring MD: Martin Blase, MD   Chief Complaint  Patient presents with   pfo     History of Present Illness:    Martin Robinson is a 60 y.o. male presenting for evaluation of PFO.  The patient was recently hospitalized July 03, 2021 with transient dysarthria, left facial droop, incoordination, and leftward leaning gait.  The patient was out of the country in [place] when his symptoms initially occurred.  The patient's wife is an emergency room nurse and immediately recognizes his symptoms of raising concern for stroke.  He was taken to an emergency room and he underwent a head CT that was reportedly negative.  However, neurology review of the films revealed a right thalamic hypodensity suggestive of acute stroke.  They came home urgently to seek further evaluation.  A CT angio of the head and neck showed a right thalamic lacunar infarct with no associated hemorrhage or mass-effect.  An MRI confirmed an acute right thalamic infarct.  An echocardiogram showed normal LV function, mild LVH, normal diastolic parameters, normal RV function, and no significant valvular disease.  The patient was initiated on dual antiplatelet therapy with aspirin and clopidogrel.  Following his hospitalization, some outpatient testing was performed.  A transcranial Doppler study showed a full curtain effect at rest and with Valsalva, indicating a Spencer grade 5 right to left shunt suggestive of large PFO.  The patient is referred for further evaluation.  An outpatient cardiac monitor was performed which showed rare PACs and PVCs, both occurring less than 1%.  There were 15 episodes of nonsustained atrial tachycardia but no evidence of atrial fibrillation.  There were no sustained arrhythmias.  Hypercoagulable work-up was completed and  this was negative for lupus anticoagulant, protein C&S deficiency, factor V Leiden, and Antithrombin activity.  Beta-2 glycoprotein's were negative.  The patient is here with his wife today. He has had fatigue ever since having Covid last year. However, since his stroke, he has had increased fatigue and forgetfulness. He's had some speech problems only when he is tired. He otherwise hasn't had specific long-term sequelae such as unilateral weakness or clumsiness. He has been able to play golf and he's walking on the golf course. The patient has previously been a marathon runner and a very active gentleman in the past.   Past Medical History:  Diagnosis Date   Dyslipidemia 07/03/2021   Melanoma [facility])     Past Surgical History:  Procedure Laterality Date   MELANOMA EXCISION      Current Medications: Current Meds  Medication Sig   aspirin EC 81 MG EC tablet Take 1 tablet (81 mg total) by mouth daily. Swallow whole.   atorvastatin (LIPITOR) 40 MG tablet Take 1 tablet (40 mg total) by mouth daily.   clopidogrel (PLAVIX) 75 MG tablet Take 1 tablet (75 mg total) by mouth daily.   Inositol Niacinate (NIACIN FLUSH FREE PO) Take 500 mg by mouth daily.   Multiple Vitamins-Minerals (MULTI FOR HIM 50+) TABS Take 1 tablet by mouth daily.   Omega-3 Fatty Acids (FISH OIL) 1000 MG CAPS Take 1,000 mg by mouth daily.   OVER THE COUNTER MEDICATION Take 6 tablets by mouth daily. 3 fruit & 3 veggie complete balance vitamins     Allergies:   Patient has  no known allergies.   Social History   Socioeconomic History   Marital status: Married    Spouse name: Not on file   Number of children: Not on file   Years of education: Not on file   Highest education level: Not on file  Occupational History   Occupation: insurance  Tobacco Use   Smoking status: Former    Types: Cigarettes, Cigars   Smokeless tobacco: Former   Tobacco comments:    Cigars, nicotine packets  Substance and Sexual Activity    Alcohol use: Yes    Alcohol/week: 10.0 standard drinks    Types: 10 Standard drinks or equivalent per week    Comment: " bunch"   Drug use: Never   Sexual activity: Never  Other Topics Concern   Not on file  Social History Narrative   Not on file   Social Determinants of Health   Financial Resource Strain: Not on file  Food Insecurity: Not on file  Transportation Needs: Not on file  Physical Activity: Not on file  Stress: Not on file  Social Connections: Not on file     Family History: The patient's family history includes CAD in his father and paternal uncle; Cancer in his father and mother; Diabetes in his father; Stroke in his paternal uncle.  ROS:   Please see the history of present illness.    All other systems reviewed and are negative.  EKGs/Labs/Other Studies Reviewed:    The following studies were reviewed today: Echo: IMPRESSIONS     1. Left ventricular ejection fraction, by estimation, is 60 to 65%. The  left ventricle has normal function. The left ventricle has no regional  wall motion abnormalities. There is mild left ventricular hypertrophy.  Left ventricular diastolic parameters  were normal.   2. Right ventricular systolic function is normal. The right ventricular  size is normal. Tricuspid regurgitation signal is inadequate for assessing  PA pressure.   3. The mitral valve is normal in structure. Trivial mitral valve  regurgitation. No evidence of mitral stenosis.   4. The aortic valve is tricuspid. Aortic valve regurgitation is not  visualized. No aortic stenosis is present.   5. The inferior vena cava is normal in size with greater than 50%  respiratory variability, suggesting right atrial pressure of 3 mmHg.   Conclusion(s)/Recommendation(s): No intracardiac source of embolism  detected on this transthoracic study. A transesophageal echocardiogram is  recommended to exclude cardiac source of embolism if clinically indicated.   Event  monitor: Sinus rhythm Patient had a min HR of 55 bpm, max HR of 240 bpm, and avg HR of 89 bpm. Rare premature atrial contractions and premature ventricular contractions (<1%) 15 episodes of nonsustained atrial tachycardia with longest lasting 20 beats 1 run of Ventricular Tachycardia occurred lasting 4 beats with a max rate of 240 bpm (avg 194 bpm). No atrial fibrillation No sustained arrhythmias  CTA Head/Neck: IMPRESSION: 1. Positive for a Right Thalamic lacunar infarct. No associated hemorrhage or mass effect. Although age indeterminate by CT, this is most likely the symptomatic abnormality as CTA further demonstrates a partially occluded Right PCA P1 segment (non-dominant due to fetal PCA origins) correlating with Right Thalamostriate Artery ischemia.   2. But otherwise normal arterial findings in the head and neck. No atherosclerosis or atherosclerotic stenosis identified.   3. And otherwise negative CT appearance of the brain.  EKG:  EKG is ordered today.  The ekg ordered today demonstrates NSR 71 bpm, within normal limits  Recent  Labs: 07/03/2021: ALT 17; BUN 15; Creatinine, Ser 1.09; Hemoglobin 13.6; Platelets 236; Potassium 3.5; Sodium 136  Recent Lipid Panel    Component Value Date/Time   CHOL 179 07/04/2021 0145   TRIG 80 07/04/2021 0145   HDL 48 07/04/2021 0145   CHOLHDL 3.7 07/04/2021 0145   VLDL 16 07/04/2021 0145   LDLCALC 115 (H) 07/04/2021 0145     Risk Assessment/Calculations:           Physical Exam:    VS:  BP 118/80 (BP Location: Left Arm, Patient Position: Sitting, Cuff Size: Normal)   Pulse 71   Wt 197 lb 9.6 oz (89.6 kg)   BMI 22.26 kg/m     Wt Readings from Last 3 Encounters:  08/10/21 197 lb 9.6 oz (89.6 kg)  07/03/21 185 lb (83.9 kg)  09/10/16 188 lb 1.6 oz (85.3 kg)     GEN:  Well nourished, well developed in no acute distress HEENT: Normal NECK: No JVD; No carotid bruits LYMPHATICS: No lymphadenopathy CARDIAC: RRR, no murmurs,  rubs, gallops RESPIRATORY:  Clear to auscultation without rales, wheezing or rhonchi  ABDOMEN: Soft, non-tender, non-distended MUSCULOSKELETAL:  No edema; No deformity  SKIN: Warm and dry NEUROLOGIC:  Alert and oriented x 3 PSYCHIATRIC:  Normal affect   ASSESSMENT:    1. PFO (patent foramen ovale)   2. 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  All available data is reviewed from his recent outpatient evaluation.  This demonstrates radiographic evidence of a right thalamic infarct.  There is no significant atherosclerotic disease in the head and neck vessels based on CT angiography.  The patient's hypercoagulability work-up is negative.  He has no history of hypertension, diabetes, or hyperlipidemia.  Transcranial Doppler study shows a curtain effect at rest on Valsalva, consistent with a large intracardiac right to left shunt.  I reviewed clinical data regarding the association of PFO and cryptogenic stroke with the patient and his wife today.  We discussed available clinical trial data evaluating long-term risk reduction with PFO closure and contrast with medical therapy.  I demonstrated the Amplatzer PFO occluder device with the patient and reviewed the procedural steps.  We discussed specific risks of the procedure including but not limited to vascular injury, bleeding, stroke, myocardial infarction, cardiac injury, arrhythmia, device embolization, device erosion, device infection, device thrombus, emergency cardiac surgery, and death.  The patient understands that any of these serious complications occur at a low risk of less than 1%.  In order to better define the presence and anatomic significance of his PFO, the patient will require a transesophageal echo study.  We again reviewed the risks, indications, and alternatives to TEE.  See below for specific risk discussion.  Once  his TEE is completed, he will be scheduled for transcatheter PFO closure as long as this is anatomically appropriate to do.  The patient and his wife have a cruise coming up in a few weeks to celebrate their 30th anniversary.  They ask about whether it is okay to take this trip.  I think as long as he is on appropriate medical therapy, he should be appropriately low risk.  I would probably go ahead and start him back on clopidogrel in anticipation of PFO closure.  This may give him some added protection during his travel.  We will also make sure that his TEE is completed prior to leaving on  the trip so that there is a treatment plan in place before he goes.   Shared Decision Making/Informed Consent The risks [esophageal damage, perforation (1:10,000 risk), bleeding, pharyngeal hematoma as well as other potential complications associated with conscious sedation including aspiration, arrhythmia, respiratory failure and death], benefits (treatment guidance and diagnostic support) and alternatives of a transesophageal echocardiogram were discussed in detail with Mr. Slape and he is willing to proceed.     Medication Adjustments/Labs and Tests Ordered: Current medicines are reviewed at length with the patient today.  Concerns regarding medicines are outlined above.  Orders Placed This Encounter  Procedures   Basic Metabolic Panel (BMET)   CBC   EKG 12-Lead   Meds ordered this encounter  Medications   clopidogrel (PLAVIX) 75 MG tablet    Sig: Take 1 tablet (75 mg total) by mouth daily.    Dispense:  90 tablet    Refill:  3    Patient Instructions  Medication Instructions:  Start Plavix 75 mg daily  *If you need a refill on your cardiac medications before your next appointment, please call your pharmacy*   Lab Work: BMET and CBC today for your TEE   If you have labs (blood work) drawn today and your tests are completely normal, you will receive your results only by: [place] (if  you have MyChart) OR A paper copy in the mail If you have any lab test that is abnormal or we need to change your treatment, we will call you to review the results.   Testing/Procedures:   Your physician has requested that you have a TEE. During a TEE, sound waves are used to create images of your heart. It provides your doctor with information about the size and shape of your heart and how well your heart's chambers and valves are working. In this test, a transducer is attached to the end of a flexible tube that's guided down your throat and into your esophagus (the tube leading from you mouth to your stomach) to get a more detailed image of your heart. You are not awake for the procedure. Please see the instruction sheet given to you today. For further information please visit [email or web address].    Follow-Up: At [facility], you and your health needs are our priority.  As part of our continuing mission to provide you with exceptional heart care, we have created designated Provider Care Teams.  These Care Teams include your primary Cardiologist (physician) and Advanced Practice Providers (APPs -  Physician Assistants and Nurse Practitioners) who all work together to provide you with the care you need, when you need it.  We recommend signing up for the patient portal called "MyChart".  Sign up information is provided on this After Visit Summary.  MyChart is used to connect with patients for Virtual Visits (Telemedicine).  Patients are able to view lab/test results, encounter notes, upcoming appointments, etc.  Non-urgent messages can be sent to your provider as well.   To learn more about what you can do with MyChart, go to [email or web address].     Signed, Sherren Mocha, MD  08/10/2021 10:21 AM    [place]

## 2021-08-12 DIAGNOSIS — I639 Cerebral infarction, unspecified: Secondary | ICD-10-CM | POA: Insufficient documentation

## 2021-08-12 DIAGNOSIS — Q2112 Patent foramen ovale: Secondary | ICD-10-CM | POA: Insufficient documentation

## 2021-08-12 NOTE — Progress Notes (Signed)
[facility]    Provider:  Dr Jaynee Eagles Requesting Provider: Lavina Hamman, MD Primary Care Provider:  Eunice Blase, MD  CC:  stroke  HPI:  Martin Robinson is a 60 y.o. male here as requested by Lavina Hamman, MD for stroke.  Patient had no significant past medical history except prior smoker and about 10 standard alcoholic drinks per week who was seen at the emergency room on July 03, 2021 for left facial droop, imbalance and incoordination, transient dysarthria, he and his wife were with family in [place] visiting when he woke up on the 23rd with symptoms, his wife took him to the emergency room with unremarkable labs and a head CT that was officially read back as "negative" however review of films brought back from [place] reveals a right thalamic hypodensity corresponding to lesion seen on CT subsequently obtained at [facility] health, it came home early from the [place] for evaluation.  CT angio of the head and neck demonstrated a partially occluded right PCA P1 segment correlating with right thalamic striate artery ischemia but otherwise no normal arterial findings in the head and neck, no atherosclerosis or atherosclerotic stenosis identified.  MRI of the brain confirmed acute right thalamic infarct.  Hypercoagulable work-up was negative.  Echocardiogram was unremarkable.  Cardiac monitor while inpatient and ambulatory outpatient did not show any arrhythmias.  TCD was strongly positive indicating a large right to left shunt likely PFO and patient is scheduled for TEE and PFO closure while on dual antiplatelet therapy.  After PFO closure I would recommend a long-term daily baby aspirin.  Luckily patient's symptoms have resolved.  Answered all questions today with him and his wife.  Review of Systems: Patient complains of symptoms per HPI as well as the following symptoms stroke. Pertinent negatives and positives per HPI. All others negative.   Social History   Socioeconomic  History   Marital status: Married    Spouse name: Not on file   Number of children: Not on file   Years of education: Not on file   Highest education level: Not on file  Occupational History   Occupation: insurance  Tobacco Use   Smoking status: Former    Types: Cigars   Smokeless tobacco: Former   Tobacco comments:    Cigars, nicotine packets  Vaping Use   Vaping Use: Never used  Substance and Sexual Activity   Alcohol use: Yes    Alcohol/week: 10.0 standard drinks    Types: 10 Standard drinks or equivalent per week    Comment: 7-10 per week   Drug use: Never   Sexual activity: Never  Other Topics Concern   Not on file  Social History Narrative   Lives at home with wife   Right handed   Caffeine: 2 cups/day   Social Determinants of Health   Financial Resource Strain: Not on file  Food Insecurity: Not on file  Transportation Needs: Not on file  Physical Activity: Not on file  Stress: Not on file  Social Connections: Not on file  Intimate Partner Violence: Not on file    Family History  Problem Relation Age of Onset   Cancer Mother    Cancer Father    Diabetes Father    CAD Father    Heart attack Father    Stroke Paternal Uncle        all died in 21s/60s   CAD Paternal Uncle    Stroke Paternal Uncle    Stroke Paternal Uncle  Past Medical History:  Diagnosis Date   Dyslipidemia 07/03/2021   Melanoma [facility])     Patient Active Problem List   Diagnosis Date Noted   Acute embolic stroke ([place]) 63/87/5643   PFO (patent foramen ovale) 08/12/2021   Ischemic stroke ([place]) 07/03/2021   Dyslipidemia 07/03/2021   Melanoma of skin ([place]) 09/10/2016    Past Surgical History:  Procedure Laterality Date   MELANOMA EXCISION      Current Outpatient Medications  Medication Sig Dispense Refill   aspirin EC 81 MG EC tablet Take 1 tablet (81 mg total) by mouth daily. Swallow whole. 30 tablet 11   atorvastatin (LIPITOR) 40 MG tablet Take 1 tablet (40 mg total) by  mouth daily. 30 tablet 2   clopidogrel (PLAVIX) 75 MG tablet Take 1 tablet (75 mg total) by mouth daily. 90 tablet 3   Inositol Niacinate (NIACIN FLUSH FREE PO) Take 500 mg by mouth daily.     Multiple Vitamins-Minerals (MULTI FOR HIM 50+) TABS Take 1 tablet by mouth daily.     Omega-3 Fatty Acids (FISH OIL) 1000 MG CAPS Take 1,000 mg by mouth daily.     OVER THE COUNTER MEDICATION Take 6 tablets by mouth daily. 3 fruit & 3 veggie complete balance vitamins     No current facility-administered medications for this visit.    Allergies as of 08/10/2021   (No Known Allergies)    Vitals: BP 126/79 (BP Location: Right Arm, Patient Position: Sitting)   Pulse 78   Ht 5\' 7"  (1.702 m)   Wt 197 lb (89.4 kg)   BMI 30.85 kg/m  Last Weight:  Wt Readings from Last 1 Encounters:  08/10/21 197 lb (89.4 kg)   Last Height:   Ht Readings from Last 1 Encounters:  08/10/21 5\' 7"  (1.702 m)   Exam: NAD, pleasant                  Speech:    Speech is normal; fluent and spontaneous with normal comprehension.  Cognition:    The patient is oriented to person, place, and time;     recent and remote memory intact;     language fluent;    Cranial Nerves:    The pupils are equal, round, and reactive to light.Trigeminal sensation is intact and the muscles of mastication are normal. The face is symmetric. The palate elevates in the midline. Hearing intact. Voice is normal. Shoulder shrug is normal. The tongue has normal motion without fasciculations.   Coordination:  No dysmetria  Motor Observation:    No asymmetry, no atrophy, and no involuntary movements noted. Tone:    Normal muscle tone.     Strength:    Strength is V/V in the upper and lower limbs.      Sensation: intact to LT     Assessment/Plan:  Lovely 60 y.o. male here with his equally lovely wife as requested by Lavina Hamman, MD for stroke.  Patient had no significant past medical history except prior smoker and about 10 standard  alcoholic drinks per week who was seen at the emergency room on July 03, 2021 for left facial droop, imbalance and incoordination, transient dysarthria, he and his wife were with family in [place] visiting when he woke up on the 23rd with symptoms, his wife took him to the emergency room with unremarkable labs and a head CT that was officially read back as "negative" however review of films brought back from [place] reveals a right thalamic hypodensity  corresponding to lesion seen on CT subsequently obtained at [facility], it came home early from the [place] for evaluation.  Evaluation and eventually found a large PFO which is the likely cause of embolic stroke and he is scheduled for TEE and closure with Dr. Burt Knack.  - CT angio of the head and neck demonstrated a partially occluded right PCA P1 segment correlating with right thalamic striate artery ischemia but otherwise no normal arterial findings in the head and neck, no atherosclerosis or atherosclerotic stenosis identified.  Likely embolic etiology. - MRI of the brain confirmed acute right thalamic infarct.  Hypercoagulable work-up was negative.   - Echocardiogram was unremarkable.  Cardiac monitor while inpatient and ambulatory outpatient did not show any arrhythmias.   - TCD was strongly positive indicating a large right to left shunt likely PFO and patient is scheduled for TEE and PFO closure while on dual antiplatelet therapy.  - After PFO closure I would recommend a long-term daily baby aspirin.   - hgba1c 5.7, technically prediabetic, f/u pcp - ldl continue lipitor -  Luckily patient's symptoms have resolved.  Answered all questions today with him and his wife.   Cc: Lavina Hamman, MD,  Eunice Blase, MD  Sarina Ill, MD  [facility] [address] [place], [place] [address]  Phone [phone] Fax [phone]  I spent 42 minutes of face-to-face and non-face-to-face time with patient on the   1. 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 PFO (patent foramen ovale)    diagnosis.  This included previsit chart review, lab review, study review, order entry, electronic health record documentation, patient education on the different diagnostic and therapeutic options, counseling and coordination of care, risks and benefits of management, compliance, or risk factor reduction

## 2021-08-17 ENCOUNTER — Ambulatory Visit (HOSPITAL_COMMUNITY)
Admission: RE | Admit: 2021-08-17 | Discharge: 2021-08-17 | Disposition: A | Discharge status: Home / Self Care | Payer: Commercial Managed Care - PPO | Source: Ambulatory Visit | Attending: Cardiovascular Disease | Admitting: Cardiovascular Disease

## 2021-08-17 ENCOUNTER — Ambulatory Visit (HOSPITAL_COMMUNITY): Payer: Commercial Managed Care - PPO | Admitting: Certified Registered Nurse Anesthetist

## 2021-08-17 ENCOUNTER — Encounter (HOSPITAL_COMMUNITY)
Admission: RE | Disposition: A | Discharge status: Home / Self Care | Payer: Self-pay | Source: Home / Self Care | Attending: Cardiovascular Disease

## 2021-08-17 ENCOUNTER — Ambulatory Visit (HOSPITAL_COMMUNITY)
Admission: RE | Admit: 2021-08-17 | Discharge: 2021-08-17 | Disposition: A | Discharge status: Home / Self Care | Payer: Commercial Managed Care - PPO | Attending: Cardiovascular Disease | Admitting: Cardiovascular Disease

## 2021-08-17 ENCOUNTER — Institutional Professional Consult (permissible substitution): Payer: Commercial Managed Care - PPO | Admitting: Internal Medicine

## 2021-08-17 ENCOUNTER — Encounter (HOSPITAL_COMMUNITY): Payer: Self-pay | Admitting: Cardiovascular Disease

## 2021-08-17 DIAGNOSIS — Z7982 Long term (current) use of aspirin: Secondary | ICD-10-CM | POA: Insufficient documentation

## 2021-08-17 DIAGNOSIS — I6381 Other cerebral infarction due to occlusion or stenosis of small artery: Secondary | ICD-10-CM | POA: Insufficient documentation

## 2021-08-17 DIAGNOSIS — Z7902 Long term (current) use of antithrombotics/antiplatelets: Secondary | ICD-10-CM | POA: Insufficient documentation

## 2021-08-17 DIAGNOSIS — Q2112 Patent foramen ovale: Secondary | ICD-10-CM | POA: Insufficient documentation

## 2021-08-17 DIAGNOSIS — Z8249 Family history of ischemic heart disease and other diseases of the circulatory system: Secondary | ICD-10-CM | POA: Insufficient documentation

## 2021-08-17 DIAGNOSIS — Z87891 Personal history of nicotine dependence: Secondary | ICD-10-CM | POA: Diagnosis not present

## 2021-08-17 DIAGNOSIS — I639 Cerebral infarction, unspecified: Secondary | ICD-10-CM | POA: Diagnosis not present

## 2021-08-17 DIAGNOSIS — Z79899 Other long term (current) drug therapy: Secondary | ICD-10-CM | POA: Insufficient documentation

## 2021-08-17 HISTORY — PX: BUBBLE STUDY: SHX6837

## 2021-08-17 HISTORY — PX: TEE WITHOUT CARDIOVERSION: SHX5443

## 2021-08-17 SURGERY — ECHOCARDIOGRAM, TRANSESOPHAGEAL
Anesthesia: Monitor Anesthesia Care

## 2021-08-17 MED ORDER — OXYCODONE HCL 5 MG/5ML PO SOLN: 5.0000 mg | Freq: Once | ORAL | Status: DC | PRN

## 2021-08-17 MED ORDER — OXYCODONE HCL 5 MG PO TABS: 5.0000 mg | ORAL_TABLET | Freq: Once | ORAL | Status: DC | PRN

## 2021-08-17 MED ORDER — ONDANSETRON HCL 4 MG/2ML IJ SOLN
4.0000 mg | Freq: Four times a day (QID) | INTRAMUSCULAR | Status: DC | PRN
Start: 2021-08-17 — End: 2021-08-17

## 2021-08-17 MED ORDER — PROPOFOL 10 MG/ML IV BOLUS
INTRAVENOUS | Status: DC | PRN
  Administered 2021-08-17 (×4): 10 mg via INTRAVENOUS

## 2021-08-17 MED ORDER — PROPOFOL 500 MG/50ML IV EMUL
INTRAVENOUS | Status: DC | PRN
  Administered 2021-08-17: 100 ug/kg/min via INTRAVENOUS

## 2021-08-17 MED ORDER — SODIUM CHLORIDE 0.9 % IV SOLN: INTRAVENOUS | Status: DC | PRN

## 2021-08-17 MED ORDER — SODIUM CHLORIDE 0.9 % IV SOLN: INTRAVENOUS | Status: DC

## 2021-08-17 MED ORDER — FENTANYL CITRATE (PF) 100 MCG/2ML IJ SOLN: 25.0000 ug | INTRAMUSCULAR | Status: DC | PRN

## 2021-08-17 MED ORDER — BUTAMBEN-TETRACAINE-BENZOCAINE 2-2-14 % EX AERO
INHALATION_SPRAY | CUTANEOUS | Status: DC | PRN
  Administered 2021-08-17: 1 via TOPICAL

## 2021-08-17 NOTE — Interval H&P Note (Signed)
History and Physical Interval Note:  08/17/2021 8:47 AM  Martin Robinson  has presented today for surgery, with the diagnosis of PRE PROCEDURE PFO.  The various methods of treatment have been discussed with the patient and family. After consideration of risks, benefits and other options for treatment, the patient has consented to  Procedure(s): TRANSESOPHAGEAL ECHOCARDIOGRAM (TEE) (N/A) as a surgical intervention.  The patient's history has been reviewed, patient examined, no change in status, stable for surgery.  I have reviewed the patient's chart and labs.  Questions were answered to the patient's satisfaction.     Jenkins Rouge

## 2021-08-17 NOTE — Anesthesia Procedure Notes (Signed)
Procedure Name: MAC Date/Time: 08/17/2021 9:45 AM Performed by: Harden Mo, CRNA Pre-anesthesia Checklist: Patient identified, Emergency Drugs available, Suction available and Patient being monitored Patient Re-evaluated:Patient Re-evaluated prior to induction Oxygen Delivery Method: Nasal cannula Preoxygenation: Pre-oxygenation with 100% oxygen Induction Type: IV induction Placement Confirmation: positive ETCO2 and breath sounds checked- equal and bilateral Dental Injury: Teeth and Oropharynx as per pre-operative assessment

## 2021-08-17 NOTE — Anesthesia Preprocedure Evaluation (Signed)
Anesthesia Evaluation  Patient identified by MRN, date of birth, ID band Patient awake    Reviewed: Allergy & Precautions, H&P , NPO status , Patient's Chart, lab work & pertinent test results  Airway Mallampati: II   Neck ROM: full    Dental   Pulmonary former smoker,    breath sounds clear to auscultation       Cardiovascular negative cardio ROS   Rhythm:regular Rate:Normal     Neuro/Psych CVA    GI/Hepatic   Endo/Other    Renal/GU      Musculoskeletal   Abdominal   Peds  Hematology   Anesthesia Other Findings   Reproductive/Obstetrics                             Anesthesia Physical Anesthesia Plan  ASA: 2  Anesthesia Plan: MAC   Post-op Pain Management:    Induction: Intravenous  PONV Risk Score and Plan: 1 and Propofol infusion and Treatment may vary due to age or medical condition  Airway Management Planned: Nasal Cannula  Additional Equipment:   Intra-op Plan:   Post-operative Plan: Extubation in OR  Informed Consent: I have reviewed the patients History and Physical, chart, labs and discussed the procedure including the risks, benefits and alternatives for the proposed anesthesia with the patient or authorized representative who has indicated his/her understanding and acceptance.     Dental advisory given  Plan Discussed with: CRNA, Anesthesiologist and Surgeon  Anesthesia Plan Comments:         Anesthesia Quick Evaluation

## 2021-08-17 NOTE — CV Procedure (Signed)
TEE: Anesthesia: Propofol  Small PFO Left to right shunt Positive bubble study No LAA thrombus Normal EF No VSD Normal PV anataomy  No effusion   Patient has f/u with Dr Burt Knack in setting of cryptogenic stroke   Jenkins Rouge MD [facility]

## 2021-08-17 NOTE — Transfer of Care (Signed)
Immediate Anesthesia Transfer of Care Note  Patient: Martin Robinson  Procedure(s) Performed: TRANSESOPHAGEAL ECHOCARDIOGRAM (TEE) BUBBLE STUDY  Patient Location: PACU  Anesthesia Type:MAC  Level of Consciousness: awake, alert  and oriented  Airway & Oxygen Therapy: Patient Spontanous Breathing  Post-op Assessment: Report given to RN, Post -op Vital signs reviewed and stable and Patient moving all extremities X 4  Post vital signs: Reviewed and stable  Last Vitals:  Vitals Value Taken Time  BP 106/77 08/17/21 1015  Temp    Pulse 86 08/17/21 1015  Resp 20 08/17/21 1015  SpO2 95 % 08/17/21 1015  Vitals shown include unvalidated device data.  Last Pain:  Vitals:   08/17/21 0900  TempSrc: Temporal  PainSc: 0-No pain         Complications: No notable events documented.

## 2021-08-18 ENCOUNTER — Encounter (HOSPITAL_COMMUNITY): Payer: Self-pay | Admitting: Cardiovascular Disease

## 2021-08-18 NOTE — Anesthesia Postprocedure Evaluation (Signed)
Anesthesia Post Note  Patient: Martin Robinson  Procedure(s) Performed: TRANSESOPHAGEAL ECHOCARDIOGRAM (TEE) BUBBLE STUDY     Patient location during evaluation: PACU Anesthesia Type: MAC Level of consciousness: awake and alert Pain management: pain level controlled Vital Signs Assessment: post-procedure vital signs reviewed and stable Respiratory status: spontaneous breathing, nonlabored ventilation, respiratory function stable and patient connected to nasal cannula oxygen Cardiovascular status: stable and blood pressure returned to baseline Postop Assessment: no apparent nausea or vomiting Anesthetic complications: no   No notable events documented.  Last Vitals:  Vitals:   08/17/21 1015 08/17/21 1030  BP: 106/77 104/82  Pulse: 86 74  Resp: 20 19  Temp: (!) 36.3 C (!) 36.1 C  SpO2: 95% 95%    Last Pain:  Vitals:   08/17/21 1030  TempSrc:   PainSc: 0-No pain                 Sarim Rothman S

## 2021-08-19 ENCOUNTER — Telehealth: Payer: Self-pay

## 2021-08-19 NOTE — Telephone Encounter (Signed)
Reviewed TEE results with the patient and he would like to be scheduled for closure. Scheduled the patient for PFO closure 09/09/2021. Will send instructions via MyChart. He was grateful for call and agrees with plan.

## 2021-08-19 NOTE — Telephone Encounter (Signed)
-----   Message from Sherren Mocha, MD sent at 08/18/2021  4:25 PM EDT ----- Gretchen Portela - glad you did that last bubble study that was much more convincing. Valetta Fuller can you let him know that he is a candidate for PFO closure and we can schedule him if he wants to proceed. I'm happy to call him if he has questions but I went over everything with him in the office.   Thx - Coop ----- Message ----- From: Josue Hector, MD Sent: 08/17/2021  10:25 AM EDT To: Sherren Mocha, MD  Ronalee Belts- he has a small PFO with positive bubble Was expecting bigger with trans cranial report but given age and cryptogenic stroke I suspect you will still want to close

## 2021-09-08 ENCOUNTER — Telehealth: Payer: Self-pay | Admitting: *Deleted

## 2021-09-08 NOTE — Telephone Encounter (Signed)
PFO closure scheduled at [facility] for: Wednesday September 09, 2021 8:30 AM [facility] Main Entrance A [facility]) at: 6:30 AM   No solid food after midnight prior to cath, clear liquids until 5 AM day of procedure.  Usual morning medications can be taken pre-cath with sips of water including: - aspirin 81 mg -Plavix 75 mg    Confirmed patient has responsible adult to drive home post procedure and be with patient first 24 hours after arriving home.  [facility] does allow one visitor to accompany you and wait in the hospital waiting room while you are there for your procedure. You and your visitor will be asked to wear a mask once you enter the hospital.   Patient reports does not currently have any new symptoms concerning for COVID-19 and no household members with COVID-19 like illness.     Reviewed procedure/mask/visitor instructions with patient.

## 2021-09-09 ENCOUNTER — Encounter (HOSPITAL_COMMUNITY)
Admission: RE | Disposition: A | Discharge status: Home / Self Care | Payer: Self-pay | Source: Home / Self Care | Attending: Cardiovascular Disease

## 2021-09-09 ENCOUNTER — Encounter (HOSPITAL_COMMUNITY): Payer: Self-pay | Admitting: Cardiovascular Disease

## 2021-09-09 ENCOUNTER — Other Ambulatory Visit: Payer: Self-pay

## 2021-09-09 ENCOUNTER — Ambulatory Visit (HOSPITAL_BASED_OUTPATIENT_CLINIC_OR_DEPARTMENT_OTHER): Payer: Commercial Managed Care - PPO

## 2021-09-09 ENCOUNTER — Ambulatory Visit (HOSPITAL_COMMUNITY)
Admission: RE | Admit: 2021-09-09 | Discharge: 2021-09-09 | Disposition: A | Discharge status: Home / Self Care | Payer: Commercial Managed Care - PPO | Attending: Cardiovascular Disease | Admitting: Cardiovascular Disease

## 2021-09-09 DIAGNOSIS — I639 Cerebral infarction, unspecified: Secondary | ICD-10-CM

## 2021-09-09 DIAGNOSIS — Z7982 Long term (current) use of aspirin: Secondary | ICD-10-CM | POA: Diagnosis not present

## 2021-09-09 DIAGNOSIS — Z7902 Long term (current) use of antithrombotics/antiplatelets: Secondary | ICD-10-CM | POA: Diagnosis not present

## 2021-09-09 DIAGNOSIS — Z87891 Personal history of nicotine dependence: Secondary | ICD-10-CM | POA: Insufficient documentation

## 2021-09-09 DIAGNOSIS — Z79899 Other long term (current) drug therapy: Secondary | ICD-10-CM | POA: Diagnosis not present

## 2021-09-09 DIAGNOSIS — Q2112 Patent foramen ovale: Secondary | ICD-10-CM

## 2021-09-09 DIAGNOSIS — Z8249 Family history of ischemic heart disease and other diseases of the circulatory system: Secondary | ICD-10-CM | POA: Diagnosis not present

## 2021-09-09 HISTORY — PX: PATENT FORAMEN OVALE(PFO) CLOSURE: CATH118300

## 2021-09-09 LAB — ECHOCARDIOGRAM LIMITED
Height: 68 in
S' Lateral: 2.4 cm
Weight: 2960 oz

## 2021-09-09 LAB — POCT ACTIVATED CLOTTING TIME: Activated Clotting Time: 196 seconds

## 2021-09-09 SURGERY — PATENT FORAMEN OVALE (PFO) CLOSURE
Anesthesia: LOCAL

## 2021-09-09 MED ORDER — HEPARIN (PORCINE) IN NACL 1000-0.9 UT/500ML-% IV SOLN
INTRAVENOUS | Status: AC
  Filled 2021-09-09: qty 500

## 2021-09-09 MED ORDER — SODIUM CHLORIDE 0.9 % IV SOLN: 250.0000 mL | INTRAVENOUS | Status: DC | PRN

## 2021-09-09 MED ORDER — CLOPIDOGREL BISULFATE 75 MG PO TABS: 75.0000 mg | ORAL_TABLET | ORAL | Status: DC

## 2021-09-09 MED ORDER — LIDOCAINE HCL (PF) 1 % IJ SOLN
INTRAMUSCULAR | Status: AC
  Filled 2021-09-09: qty 30

## 2021-09-09 MED ORDER — LABETALOL HCL 5 MG/ML IV SOLN: 10.0000 mg | INTRAVENOUS | Status: DC | PRN

## 2021-09-09 MED ORDER — SODIUM CHLORIDE 0.9% FLUSH: 3.0000 mL | Freq: Two times a day (BID) | INTRAVENOUS | Status: DC

## 2021-09-09 MED ORDER — SODIUM CHLORIDE 0.9 % WEIGHT BASED INFUSION: 1.0000 mL/kg/h | INTRAVENOUS | Status: DC

## 2021-09-09 MED ORDER — HEPARIN (PORCINE) IN NACL 1000-0.9 UT/500ML-% IV SOLN
INTRAVENOUS | Status: DC | PRN
  Administered 2021-09-09: 500 mL

## 2021-09-09 MED ORDER — HYDRALAZINE HCL 20 MG/ML IJ SOLN: 10.0000 mg | INTRAMUSCULAR | Status: DC | PRN

## 2021-09-09 MED ORDER — FENTANYL CITRATE (PF) 100 MCG/2ML IJ SOLN
INTRAMUSCULAR | Status: DC | PRN
  Administered 2021-09-09: 50 ug via INTRAVENOUS
  Administered 2021-09-09: 25 ug via INTRAVENOUS

## 2021-09-09 MED ORDER — CEFAZOLIN SODIUM-DEXTROSE 2-3 GM-%(50ML) IV SOLR
INTRAVENOUS | Status: DC | PRN
  Administered 2021-09-09: 2 g via INTRAVENOUS

## 2021-09-09 MED ORDER — MIDAZOLAM HCL 2 MG/2ML IJ SOLN
INTRAMUSCULAR | Status: DC | PRN
  Administered 2021-09-09: 1 mg via INTRAVENOUS
  Administered 2021-09-09: 2 mg via INTRAVENOUS

## 2021-09-09 MED ORDER — SODIUM CHLORIDE 0.9% FLUSH: 3.0000 mL | INTRAVENOUS | Status: DC | PRN

## 2021-09-09 MED ORDER — MIDAZOLAM HCL 2 MG/2ML IJ SOLN
INTRAMUSCULAR | Status: AC
  Filled 2021-09-09: qty 2

## 2021-09-09 MED ORDER — HEPARIN SODIUM (PORCINE) 1000 UNIT/ML IJ SOLN
INTRAMUSCULAR | Status: AC
  Filled 2021-09-09: qty 1

## 2021-09-09 MED ORDER — ACETAMINOPHEN 325 MG PO TABS: 650.0000 mg | ORAL_TABLET | ORAL | Status: DC | PRN

## 2021-09-09 MED ORDER — SODIUM CHLORIDE 0.9 % WEIGHT BASED INFUSION
3.0000 mL/kg/h | INTRAVENOUS | Status: AC
  Administered 2021-09-09: 3 mL/kg/h via INTRAVENOUS

## 2021-09-09 MED ORDER — PERFLUTREN LIPID MICROSPHERE
1.0000 mL | INTRAVENOUS | Status: AC | PRN
  Administered 2021-09-09: 2 mL via INTRAVENOUS
  Filled 2021-09-09: qty 10

## 2021-09-09 MED ORDER — HEPARIN SODIUM (PORCINE) 1000 UNIT/ML IJ SOLN
INTRAMUSCULAR | Status: DC | PRN
  Administered 2021-09-09: 2000 [IU] via INTRAVENOUS
  Administered 2021-09-09: 6000 [IU] via INTRAVENOUS

## 2021-09-09 MED ORDER — ASPIRIN 81 MG PO CHEW: 81.0000 mg | CHEWABLE_TABLET | ORAL | Status: DC

## 2021-09-09 MED ORDER — ONDANSETRON HCL 4 MG/2ML IJ SOLN: 4.0000 mg | Freq: Four times a day (QID) | INTRAMUSCULAR | Status: DC | PRN

## 2021-09-09 MED ORDER — FENTANYL CITRATE (PF) 100 MCG/2ML IJ SOLN
INTRAMUSCULAR | Status: AC
  Filled 2021-09-09: qty 2

## 2021-09-09 MED ORDER — CEFAZOLIN SODIUM-DEXTROSE 2-4 GM/100ML-% IV SOLN
2.0000 g | INTRAVENOUS | Status: DC
  Filled 2021-09-09: qty 100

## 2021-09-09 SURGICAL SUPPLY — 19 items
CATH ACUNAV 8FR 90CM (CATHETERS) ×1 IMPLANT
CATH SUPER TORQUE PLUS 6F MPA1 (CATHETERS) ×1 IMPLANT
CLOSURE PERCLOSE PROSTYLE (VASCULAR PRODUCTS) ×2 IMPLANT
COVER SWIFTLINK CONNECTOR (BAG) ×1 IMPLANT
GUIDEWIRE AMPLATZER 1.5JX260 (WIRE) ×1 IMPLANT
KIT HEART LEFT (KITS) ×2 IMPLANT
OCCLUDER PFO TALISMAN 25-18 (Prosthesis & Implant Heart) IMPLANT
PACK CARDIAC CATHETERIZATION (CUSTOM PROCEDURE TRAY) ×2 IMPLANT
PROTECTION STATION PRESSURIZED (MISCELLANEOUS) ×2
SHEATH DELIVERY TALISMAN 8F 80 (SHEATH) IMPLANT
SHEATH INTROD W/O MIN 9FR 25CM (SHEATH) ×1 IMPLANT
SHEATH PINNACLE 8F 10CM (SHEATH) ×1 IMPLANT
SHEATH PROBE COVER 6X72 (BAG) ×1 IMPLANT
STATION PROTECTION PRESSURIZED (MISCELLANEOUS) IMPLANT
TALISMAN DELIVERY SHEATH 8F 80 (SHEATH) ×2
TALISMAN PFO OCCLUDER 25-18 (Prosthesis & Implant Heart) ×2 IMPLANT
TRANSDUCER W/STOPCOCK (MISCELLANEOUS) ×2 IMPLANT
TUBING CIL FLEX 10 FLL-RA (TUBING) ×2 IMPLANT
WIRE EMERALD 3MM-J .035X150CM (WIRE) ×1 IMPLANT

## 2021-09-09 NOTE — Interval H&P Note (Signed)
History and Physical Interval Note:  09/09/2021 7:41 AM  Martin Robinson  has presented today for surgery, with the diagnosis of pfo.  The various methods of treatment have been discussed with the patient and family. After consideration of risks, benefits and other options for treatment, the patient has consented to  Procedure(s): PATENT FORAMEN OVALE (PFO) CLOSURE (N/A) as a surgical intervention.  The patient's history has been reviewed, patient examined, no change in status, stable for surgery.  I have reviewed the patient's chart and labs.  Questions were answered to the patient's satisfaction.     Sherren Mocha

## 2021-09-09 NOTE — Discharge Instructions (Signed)

## 2021-10-07 NOTE — Progress Notes (Signed)
[facility] [place]                                     Cardiology Office Note:    Date:  10/12/2021   ID:  [name], DOB [birth year], MRN [id number]  PCP:  Eunice Blase, MD  [facility] Cardiologist:  None  [facility] Electrophysiologist:  None   Referring MD: Eunice Blase, MD   Chief Complaint  Patient presents with   Follow-up    S/p PFO closure    History of Present Illness:    BEAUDEN TREMONT is a 60 y.o. male with a hx of CVA and HLD who was referred to Dr. Burt Knack for the evaluation of PFO device on 08/10/21. He had been hospitalized 07/03/21 with transient dysarthria, left facial droop, incoordination, and leftward leaning gait. The patient was out of the country in [place] when his symptoms initially occurred. The patient's wife is an emergency room nurse and immediately recognized his symptoms, raising concern for stroke. Head CT at that time was reportedly negative however, neurology reviewed the films and felt there was a right thalamic hypodensity suggestive of acute stroke. Upon arrival back to the [place], he underwent a CT angio of the head and neck which showed a right thalamic lacunar infarct with no associated hemorrhage or mass-effect. An MRI confirmed an acute right thalamic infarct. An echocardiogram showed normal LV function, mild LVH, normal diastolic parameters, normal RV function, and no significant valvular disease. The patient was initiated on DAPT with aspirin and Plavix. A transcranial Doppler study showed a full curtain effect at rest and with Valsalva, indicating a Spencer grade 5 right to left shunt suggestive of large PFO. He wore an OP monitor which showed showed rare PACs and PVCs, both occurring less than 1%. There were 15 episodes of nonsustained atrial tachycardia but no evidence of atrial fibrillation. There were no sustained arrhythmias. Hypercoagulable work-up was completed and this was negative  for lupus anticoagulant, protein C&S deficiency, factor V Leiden, and Antithrombin activity. Beta-2 glycoprotein's were negative.  He underwent successful transcatheter PFO closure using a 25 mm Amplatzer PFO occluder under fluoroscopic and intracardiac echo guidance on 09/09/21. Medication plan is for DAPT with ASA and Plavix for 6 months then lifelong ASA 81mg  PO QD. Follow up limited echo showed no atrial level shunt detected by color flow Doppler  He comes today with his wife and reports that he has been doing exceptional since his procedure. He denies SOB, LE edema, palpitations, orthopnea, dizziness, or syncope. He is asking about restarting running which is appropriate. SBE discussed as well as Plavix stop date.   Past Medical History:  Diagnosis Date   Dyslipidemia 07/03/2021   Melanoma [facility])    Past Surgical History:  Procedure Laterality Date   BUBBLE STUDY  08/17/2021   Procedure: BUBBLE STUDY;  Surgeon: Josue Hector, MD;  Location: [facility] ENDOSCOPY;  Service: Cardiovascular;;   MELANOMA EXCISION     PATENT FORAMEN OVALE(PFO) CLOSURE N/A 09/09/2021   Procedure: PATENT FORAMEN OVALE (PFO) CLOSURE;  Surgeon: Sherren Mocha, MD;  Location: [facility] CV LAB;  Service: Cardiovascular;  Laterality: N/A;   TEE WITHOUT CARDIOVERSION N/A 08/17/2021   Procedure: TRANSESOPHAGEAL ECHOCARDIOGRAM (TEE);  Surgeon: Josue Hector, MD;  Location: [facility] ENDOSCOPY;  Service: Cardiovascular;  Laterality: N/A;    Current Medications: Current Meds  Medication Sig  amoxicillin (AMOXIL) 500 MG tablet Take 1 tablet (500 mg total) by mouth as directed. TAKE 4 TABLETS (2000 MG) 1 HOUR BEFORE DENTAL PROCEDURES.   aspirin EC 81 MG EC tablet Take 1 tablet (81 mg total) by mouth daily. Swallow whole.   atorvastatin (LIPITOR) 40 MG tablet Take 1 tablet (40 mg total) by mouth daily.   cetirizine (ZYRTEC) 10 MG tablet Take 10 mg by mouth daily.   Cholecalciferol (VITAMIN D-3) 125 MCG (5000 UT) TABS Take 5,000  Units by mouth in the morning.   clopidogrel (PLAVIX) 75 MG tablet Take 1 tablet (75 mg total) by mouth daily.   Multiple Vitamins-Minerals (MEGAVITE FRUITS & VEGGIES PO) Take 6 tablets by mouth in the morning. 3 Fruit & 3 Veggie Complete Balance vitamins     Allergies:   Patient has no known allergies.   Social History   Socioeconomic History   Marital status: Married    Spouse name: Not on file   Number of children: Not on file   Years of education: Not on file   Highest education level: Not on file  Occupational History   Occupation: insurance  Tobacco Use   Smoking status: Former    Types: Cigars   Smokeless tobacco: Former   Tobacco comments:    Cigars, nicotine packets  Vaping Use   Vaping Use: Never used  Substance and Sexual Activity   Alcohol use: Yes    Alcohol/week: 10.0 standard drinks    Types: 10 Standard drinks or equivalent per week    Comment: 7-10 per week   Drug use: Never   Sexual activity: Never  Other Topics Concern   Not on file  Social History Narrative   Lives at home with wife   Right handed   Caffeine: 2 cups/day   Social Determinants of Health   Financial Resource Strain: Not on file  Food Insecurity: Not on file  Transportation Needs: Not on file  Physical Activity: Not on file  Stress: Not on file  Social Connections: Not on file     Family History: The patient's family history includes CAD in his father and paternal uncle; Cancer in his father and mother; Diabetes in his father; Heart attack in his father; Stroke in his paternal uncle, paternal uncle, and paternal uncle.  ROS:   Please see the history of present illness.    All other systems reviewed and are negative.  EKGs/Labs/Other Studies Reviewed:    The following studies were reviewed today:  Echocardiogram 09/09/21:   1. Normal LV size and function. Left ventricular ejection fraction, by  estimation, is 60 to 65%. The left ventricle has normal function.   2. Septum  is flattened suggestive of RV pressure overload. Right  ventricular systolic function is mildly reduced. The right ventricular  size is moderately enlarged.   3. No evidence of mitral valve regurgitation.   4. Aortic valve regurgitation is not visualized. No aortic stenosis is  present.   5. The inferior vena cava is normal in size with greater than 50%  respiratory variability, suggesting right atrial pressure of 3 mmHg.   PFO closure 09/09/21:  Successful transcatheter PFO closure using a 25 mm Amplatzer PFO occluder under fluoroscopic and intracardiac echo guidance   Recommend: ASA and plavix x 6 months Same day DC protocol Limited echo in recovery area  EKG:  EKG is not ordered today.    Recent Labs: 07/03/2021: ALT 17 08/10/2021: BUN 14; Creatinine, Ser 0.96; Hemoglobin 15.0; Platelets 268;  Potassium 4.5; Sodium 138  Recent Lipid Panel    Component Value Date/Time   CHOL 179 07/04/2021 0145   TRIG 80 07/04/2021 0145   HDL 48 07/04/2021 0145   CHOLHDL 3.7 07/04/2021 0145   VLDL 16 07/04/2021 0145   LDLCALC 115 (H) 07/04/2021 0145   Physical Exam:    VS:  BP 122/72   Pulse 77   Ht 5\' 8"  (1.727 m)   Wt 197 lb (89.4 kg)   SpO2 97%   BMI 29.95 kg/m     Wt Readings from Last 3 Encounters:  10/12/21 197 lb (89.4 kg)  09/09/21 185 lb (83.9 kg)  08/17/21 185 lb (83.9 kg)    General: Well developed, well nourished, NAD Lungs:Clear to ausculation bilaterally. No wheezes, rales, or rhonchi. Breathing is unlabored. Cardiovascular: RRR with S1 S2. No murmur Extremities: No edema. Neuro: Alert and oriented. No focal deficits. No facial asymmetry. MAE spontaneously. Psych: Responds to questions appropriately with normal affect.    ASSESSMENT/PLAN:    PFO: Pt underwent successful transcatheter PFO closure using a 25 mm Amplatzer PFO occluder under fluoroscopic and intracardiac echo guidance on 09/09/21. Medication plan is for DAPT with ASA and Plavix for 6 months (03/2022)  then lifelong ASA 81mg  PO QD. SBE discussed (can stop 03/2022). RX for Amoxicillin 2g 1 hour prior to dental procedures and dental work. Follow up in one year with myself with echocardiogram/bubble.   CVA: Hospitalized 07/03/21 with transient dysarthria, left facial droop, incoordination, and leftward leaning gait while in [place]. Head CT at that time was reportedly negative however, neurology reviewed the films and felt there was a right thalamic hypodensity suggestive of acute stroke. Upon arrival back to the [place], he underwent a CT angio of the head and neck which showed a right thalamic lacunar infarct with no associated hemorrhage or mass-effect. An MRI confirmed an acute right thalamic infarct. Referred to Dr. Burt Knack for PFO closure after echo revealed grade 5 right to left shunt. See plan above   Medication Adjustments/Labs and Tests Ordered: Current medicines are reviewed at length with the patient today.  Concerns regarding medicines are outlined above.  Orders Placed This Encounter  Procedures   ECHOCARDIOGRAM LIMITED BUBBLE STUDY   Meds ordered this encounter  Medications   amoxicillin (AMOXIL) 500 MG tablet    Sig: Take 1 tablet (500 mg total) by mouth as directed. TAKE 4 TABLETS (2000 MG) 1 HOUR BEFORE DENTAL PROCEDURES.    Dispense:  8 tablet    Refill:  0    Patient Instructions  Medication Instructions:  Your physician has recommended you make the following change in your medication:  TAKE AMOXICILLIN 4 TABLETS (2000 MG) 1 HOUR BEFORE DENTAL PROCEDURES.  *If you need a refill on your cardiac medications before your next appointment, please call your pharmacy*   Lab Work: NONE If you have labs (blood work) drawn today and your tests are completely normal, you will receive your results only by: [place] (if you have MyChart) OR A paper copy in the mail If you have any lab test that is abnormal or we need to change your treatment, we will call you to review the  results.   Testing/Procedures: Your physician has requested that you have an echocardiogram BUBBLE STUDY. Echocardiography is a painless test that uses sound waves to create images of your heart. It provides your doctor with information about the size and shape of your heart and how well your heart's chambers and valves are  working. This procedure takes approximately one hour. There are no restrictions for this procedure. TO BE DONE IN 1 YEAR SAME DAY AS FOLLOW-UP APPOINTMENT.   Follow-Up: At [facility], you and your health needs are our priority.  As part of our continuing mission to provide you with exceptional heart care, we have created designated Provider Care Teams.  These Care Teams include your primary Cardiologist (physician) and Advanced Practice Providers (APPs -  Physician Assistants and Nurse Practitioners) who all work together to provide you with the care you need, when you need it.  We recommend signing up for the patient portal called "MyChart".  Sign up information is provided on this After Visit Summary.  MyChart is used to connect with patients for Virtual Visits (Telemedicine).  Patients are able to view lab/test results, encounter notes, upcoming appointments, etc.  Non-urgent messages can be sent to your provider as well.   To learn more about what you can do with MyChart, go to [email or web address].    Your next appointment:   1 YEAR  The format for your next appointment:   In Person  Provider:   Kathyrn Drown, NP{     Signed, Kathyrn Drown, NP  10/12/2021 11:21 AM    [place]

## 2021-10-12 ENCOUNTER — Ambulatory Visit (INDEPENDENT_AMBULATORY_CARE_PROVIDER_SITE_OTHER): Payer: Commercial Managed Care - PPO | Admitting: Cardiology

## 2021-10-12 ENCOUNTER — Other Ambulatory Visit: Payer: Self-pay

## 2021-10-12 ENCOUNTER — Encounter: Payer: Self-pay | Admitting: Cardiology

## 2021-10-12 VITALS — BP 122/72 | HR 77 | Ht 68.0 in | Wt 197.0 lb

## 2021-10-12 DIAGNOSIS — Q2112 Patent foramen ovale: Secondary | ICD-10-CM | POA: Diagnosis not present

## 2021-10-12 DIAGNOSIS — I6381 Other cerebral infarction due to occlusion or stenosis of small artery: Secondary | ICD-10-CM

## 2021-10-12 DIAGNOSIS — I639 Cerebral infarction, unspecified: Secondary | ICD-10-CM | POA: Diagnosis not present

## 2021-10-12 MED ORDER — AMOXICILLIN 500 MG PO TABS: 500.0000 mg | ORAL_TABLET | ORAL | 0 refills | Status: DC

## 2021-10-12 NOTE — Patient Instructions (Signed)
Medication Instructions:  Your physician has recommended you make the following change in your medication:  TAKE AMOXICILLIN 4 TABLETS (2000 MG) 1 HOUR BEFORE DENTAL PROCEDURES.  *If you need a refill on your cardiac medications before your next appointment, please call your pharmacy*   Lab Work: NONE If you have labs (blood work) drawn today and your tests are completely normal, you will receive your results only by: [place] (if you have MyChart) OR A paper copy in the mail If you have any lab test that is abnormal or we need to change your treatment, we will call you to review the results.   Testing/Procedures: Your physician has requested that you have an echocardiogram BUBBLE STUDY. Echocardiography is a painless test that uses sound waves to create images of your heart. It provides your doctor with information about the size and shape of your heart and how well your heart's chambers and valves are working. This procedure takes approximately one hour. There are no restrictions for this procedure. TO BE DONE IN 1 YEAR SAME DAY AS FOLLOW-UP APPOINTMENT.   Follow-Up: At [facility], you and your health needs are our priority.  As part of our continuing mission to provide you with exceptional heart care, we have created designated Provider Care Teams.  These Care Teams include your primary Cardiologist (physician) and Advanced Practice Providers (APPs -  Physician Assistants and Nurse Practitioners) who all work together to provide you with the care you need, when you need it.  We recommend signing up for the patient portal called "MyChart".  Sign up information is provided on this After Visit Summary.  MyChart is used to connect with patients for Virtual Visits (Telemedicine).  Patients are able to view lab/test results, encounter notes, upcoming appointments, etc.  Non-urgent messages can be sent to your provider as well.   To learn more about what you can do with MyChart, go to  [email or web address].    Your next appointment:   1 YEAR  The format for your next appointment:   In Person  Provider:   Kathyrn Drown, [facility]

## 2022-02-03 ENCOUNTER — Other Ambulatory Visit: Payer: Self-pay | Admitting: Family Medicine

## 2022-02-03 DIAGNOSIS — R413 Other amnesia: Secondary | ICD-10-CM

## 2022-02-16 ENCOUNTER — Ambulatory Visit
Admission: RE | Admit: 2022-02-16 | Discharge: 2022-02-16 | Disposition: A | Discharge status: Home / Self Care | Payer: Commercial Managed Care - PPO | Source: Ambulatory Visit | Attending: Family Medicine | Admitting: Family Medicine

## 2022-02-16 DIAGNOSIS — R413 Other amnesia: Secondary | ICD-10-CM
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
## 2022-02-17 ENCOUNTER — Encounter: Payer: Self-pay | Admitting: Physical Therapy

## 2022-02-17 ENCOUNTER — Ambulatory Visit: Payer: Commercial Managed Care - PPO | Attending: Family Medicine | Admitting: Physical Therapy

## 2022-02-17 DIAGNOSIS — M545 Low back pain, unspecified: Secondary | ICD-10-CM | POA: Insufficient documentation

## 2022-09-15 ENCOUNTER — Other Ambulatory Visit: Payer: Self-pay | Admitting: Cardiology

## 2022-09-15 DIAGNOSIS — Q2112 Patent foramen ovale: Secondary | ICD-10-CM

## 2022-10-10 NOTE — Progress Notes (Unsigned)
[place]                                     Cardiology Office Note:    Date:  10/13/2022   ID:  Martin Robinson, DOB [birth year], MRN [id number]  PCP:  Eunice Blase, MD  [facility] Cardiologist:  Dr. Burt Knack, MD (PFO closure)   Referring MD: Eunice Blase, MD   Chief Complaint  Patient presents with   Follow-up    One year s/p PFO closure    History of Present Illness:    Martin Robinson is a 61 y.o. male with a hx of  CVA and HLD who was referred to Dr. Burt Knack for the evaluation of PFO device on 08/10/21. He had been hospitalized 07/03/21 with transient dysarthria, left facial droop, incoordination, and leftward leaning gait. The patient was out of the country in [place] when his symptoms initially occurred. The patient's wife is an emergency room nurse and immediately recognized his symptoms, raising concern for stroke. Head CT at that time was reportedly negative however, neurology reviewed the films and felt there was a right thalamic hypodensity suggestive of acute stroke. Upon arrival back to the [place], he underwent a CT angio of the head and neck which showed a right thalamic lacunar infarct with no associated hemorrhage or mass-effect. An MRI confirmed an acute right thalamic infarct. An echocardiogram showed normal LV function, mild LVH, normal diastolic parameters, normal RV function, and no significant valvular disease. The patient was initiated on DAPT with aspirin and Plavix. A transcranial Doppler study showed a full curtain effect at rest and with Valsalva, indicating a Spencer grade 5 right to left shunt suggestive of large PFO. He wore an OP monitor which showed showed rare PACs and PVCs, both occurring less than 1%. There were 15 episodes of nonsustained atrial tachycardia but no evidence of atrial fibrillation. There were no sustained arrhythmias. Hypercoagulable work-up was completed and this was negative for lupus  anticoagulant, protein C&S deficiency, factor V Leiden, and Antithrombin activity. Beta-2 glycoprotein's were negative.   He underwent successful transcatheter PFO closure using a 25 mm Amplatzer PFO occluder under fluoroscopic and intracardiac echo guidance on 09/09/21. Medication plan is for DAPT with ASA and Plavix for 6 months then lifelong ASA '81mg'$  PO QD. Follow up limited echo showed no atrial level shunt detected by color flow Doppler   He was seen in follow up with myself and was doing very well. He comes today alone and reports that he has continued to do well however feels rather upset with himself that he has not started exercising and running again. He would like to get back on a routine but feels unmotivated. He has no chest pain, SOB, LE edema, palpitations, orthopnea, neuro or visual changes, dizziness, or syncope. He has been off Plavix since the 6 month mark and will remain on ASA monotherapy per Dr. Burt Knack.  Past Medical History:  Diagnosis Date   Dyslipidemia 07/03/2021   Melanoma [facility])     Past Surgical History:  Procedure Laterality Date   BUBBLE STUDY  08/17/2021   Procedure: BUBBLE STUDY;  Surgeon: Josue Hector, MD;  Location: [facility] ENDOSCOPY;  Service: Cardiovascular;;   MELANOMA EXCISION     PATENT FORAMEN OVALE(PFO) CLOSURE N/A 09/09/2021   Procedure: PATENT FORAMEN OVALE (PFO) CLOSURE;  Surgeon: Sherren Mocha, MD;  Location: [facility]  INVASIVE CV LAB;  Service: Cardiovascular;  Laterality: N/A;   TEE WITHOUT CARDIOVERSION N/A 08/17/2021   Procedure: TRANSESOPHAGEAL ECHOCARDIOGRAM (TEE);  Surgeon: Josue Hector, MD;  Location: [facility] ENDOSCOPY;  Service: Cardiovascular;  Laterality: N/A;    Current Medications: Current Meds  Medication Sig   aspirin EC 81 MG EC tablet Take 1 tablet (81 mg total) by mouth daily. Swallow whole.   atorvastatin (LIPITOR) 40 MG tablet Take 1 tablet (40 mg total) by mouth daily.   cetirizine (ZYRTEC) 10 MG tablet Take 10 mg by mouth daily.    Cholecalciferol (VITAMIN D-3) 125 MCG (5000 UT) TABS Take 5,000 Units by mouth in the morning.   Multiple Vitamins-Minerals (MEGAVITE FRUITS & VEGGIES PO) Take 6 tablets by mouth in the morning. 3 Fruit & 3 Veggie Complete Balance vitamins   SYNTHROID 75 MCG tablet Take by mouth daily.   [DISCONTINUED] amoxicillin (AMOXIL) 500 MG tablet Take 1 tablet (500 mg total) by mouth as directed. TAKE 4 TABLETS (2000 MG) 1 HOUR BEFORE DENTAL PROCEDURES.   [DISCONTINUED] clopidogrel (PLAVIX) 75 MG tablet Take 1 tablet (75 mg total) by mouth daily.     Allergies:   Patient has no known allergies.   Social History   Socioeconomic History   Marital status: Married    Spouse name: Not on file   Number of children: Not on file   Years of education: Not on file   Highest education level: Not on file  Occupational History   Occupation: insurance  Tobacco Use   Smoking status: Former    Types: Cigars   Smokeless tobacco: Former   Tobacco comments:    Cigars, nicotine packets  Vaping Use   Vaping Use: Never used  Substance and Sexual Activity   Alcohol use: Yes    Alcohol/week: 10.0 standard drinks of alcohol    Types: 10 Standard drinks or equivalent per week    Comment: 7-10 per week   Drug use: Never   Sexual activity: Never  Other Topics Concern   Not on file  Social History Narrative   Lives at home with wife   Right handed   Caffeine: 2 cups/day   Social Determinants of Health   Financial Resource Strain: Not on file  Food Insecurity: Not on file  Transportation Needs: Not on file  Physical Activity: Not on file  Stress: Not on file  Social Connections: Not on file     Family History: The patient's family history includes CAD in his father and paternal uncle; Cancer in his father and mother; Diabetes in his father; Heart attack in his father; Stroke in his paternal uncle, paternal uncle, and paternal uncle.  ROS:   Please see the history of present illness.    All other  systems reviewed and are negative.  EKGs/Labs/Other Studies Reviewed:    The following studies were reviewed today:  Echocardiogram with bubble 10/11/22:   1. The patient is s/p PFO closure with a 25 mm Amplatzer PFO occluder  device. There is no evidence of atrial level shunt by agitated saline  bubble study or by color doppler interrogation.   2. Left ventricular ejection fraction, by estimation, is 55 to 60%. The  left ventricle has normal function. Left ventricular diastolic parameters  were normal.   3. Right ventricular systolic function is normal. The right ventricular  size is normal.   4. The mitral valve is normal in structure. No evidence of mitral valve  regurgitation. No evidence of mitral stenosis.  5. The aortic valve is tricuspid. Aortic valve regurgitation is not  visualized. No aortic stenosis is present.   6. The inferior vena cava is normal in size with greater than 50%  respiratory variability, suggesting right atrial pressure of 3 mmHg.    Echocardiogram 09/09/21:   1. Normal LV size and function. Left ventricular ejection fraction, by  estimation, is 60 to 65%. The left ventricle has normal function.   2. Septum is flattened suggestive of RV pressure overload. Right  ventricular systolic function is mildly reduced. The right ventricular  size is moderately enlarged.   3. No evidence of mitral valve regurgitation.   4. Aortic valve regurgitation is not visualized. No aortic stenosis is  present.   5. The inferior vena cava is normal in size with greater than 50%  respiratory variability, suggesting right atrial pressure of 3 mmHg.    PFO closure 09/09/21:   Successful transcatheter PFO closure using a 25 mm Amplatzer PFO occluder under fluoroscopic and intracardiac echo guidance   Recommend: ASA and plavix x 6 months Same day DC protocol Limited echo in recovery area  EKG:  EKG is not ordered today.    Recent Labs: No results found for requested  labs within last 365 days.  Recent Lipid Panel    Component Value Date/Time   CHOL 179 07/04/2021 0145   TRIG 80 07/04/2021 0145   HDL 48 07/04/2021 0145   CHOLHDL 3.7 07/04/2021 0145   VLDL 16 07/04/2021 0145   LDLCALC 115 (H) 07/04/2021 0145    Physical Exam:    VS:  BP 116/82   Pulse 81   Ht '5\' 7"'$  (1.702 m)   Wt 187 lb (84.8 kg)   SpO2 99%   BMI 29.29 kg/m     Wt Readings from Last 3 Encounters:  10/11/22 187 lb (84.8 kg)  10/12/21 197 lb (89.4 kg)  09/09/21 185 lb (83.9 kg)    General: Well developed, well nourished, NAD Lungs:Clear to ausculation bilaterally. No wheezes, rales, or rhonchi. Breathing is unlabored. Cardiovascular: RRR with S1 S2. No murmurs Extremities: No edema.  Neuro: Alert and oriented. No focal deficits. No facial asymmetry. MAE spontaneously. Psych: Responds to questions appropriately with normal affect.    ASSESSMENT/PLAN:    PFO: Pt underwent successful transcatheter PFO closure using a 25 mm Amplatzer PFO occluder under fluoroscopic and intracardiac echo guidance on 09/09/21. He was treated with 6 months DAPT with ASA and Plavix. He will need to be on lifelong ASA '81mg'$  PO QD. No longer needs dental SBE. Echocardiogram today with stable device and no shunting or agitated saline seen on imaging. Can follow PRN.      CVA: No new neuro changes. Doing well with no changes needed today.    Medication Adjustments/Labs and Tests Ordered: Current medicines are reviewed at length with the patient today.  Concerns regarding medicines are outlined above.  Orders Placed This Encounter  Procedures   EKG 12-Lead   No orders of the defined types were placed in this encounter.   Patient Instructions  Medication Instructions:  Your physician has recommended you make the following change in your medication:  REMOVED: clopidogrel (Plavix) and Amoxicillin from your medication list  *If you need a refill on your cardiac medications before your next  appointment, please call your pharmacy*   Lab Work: NONE If you have labs (blood work) drawn today and your tests are completely normal, you will receive your results only by: [place] (if you  have MyChart) OR A paper copy in the mail If you have any lab test that is abnormal or we need to change your treatment, we will call you to review the results.   Testing/Procedures: NONE   Follow-Up: As Needed At [facility], you and your health needs are our priority.  As part of our continuing mission to provide you with exceptional heart care, we have created designated Provider Care Teams.  These Care Teams include your primary Cardiologist (physician) and Advanced Practice Providers (APPs -  Physician Assistants and Nurse Practitioners) who all work together to provide you with the care you need, when you need it.    Important Information About Sugar         Signed, Kathyrn Drown, NP  10/13/2022 2:42 PM    [facility]

## 2022-10-11 ENCOUNTER — Ambulatory Visit: Payer: Commercial Managed Care - PPO

## 2022-10-11 ENCOUNTER — Other Ambulatory Visit (HOSPITAL_COMMUNITY): Payer: Commercial Managed Care - PPO

## 2022-10-11 ENCOUNTER — Ambulatory Visit: Payer: Commercial Managed Care - PPO | Attending: Cardiology | Admitting: Cardiology

## 2022-10-11 ENCOUNTER — Ambulatory Visit (HOSPITAL_BASED_OUTPATIENT_CLINIC_OR_DEPARTMENT_OTHER): Payer: Commercial Managed Care - PPO

## 2022-10-11 VITALS — BP 116/82 | HR 81 | Ht 67.0 in | Wt 187.0 lb

## 2022-10-11 DIAGNOSIS — I639 Cerebral infarction, unspecified: Secondary | ICD-10-CM | POA: Insufficient documentation

## 2022-10-11 DIAGNOSIS — Q2112 Patent foramen ovale: Secondary | ICD-10-CM | POA: Insufficient documentation

## 2022-10-11 LAB — ECHOCARDIOGRAM LIMITED BUBBLE STUDY
Area-P 1/2: 4.93 cm2
Height: 67 in
S' Lateral: 3.1 cm
Weight: 2992 oz

## 2022-10-11 NOTE — Patient Instructions (Signed)
Medication Instructions:  Your physician has recommended you make the following change in your medication:  REMOVED: clopidogrel (Plavix) and Amoxicillin from your medication list  *If you need a refill on your cardiac medications before your next appointment, please call your pharmacy*   Lab Work: NONE If you have labs (blood work) drawn today and your tests are completely normal, you will receive your results only by: [place] (if you have MyChart) OR A paper copy in the mail If you have any lab test that is abnormal or we need to change your treatment, we will call you to review the results.   Testing/Procedures: NONE   Follow-Up: As Needed At [facility], you and your health needs are our priority.  As part of our continuing mission to provide you with exceptional heart care, we have created designated Provider Care Teams.  These Care Teams include your primary Cardiologist (physician) and Advanced Practice Providers (APPs -  Physician Assistants and Nurse Practitioners) who all work together to provide you with the care you need, when you need it.    Important Information About Sugar
